# Patient Record
Sex: Male | Born: 1955
Health system: Southern US, Community
[De-identification: ages and names within clinical notes are randomized; demographics above are authoritative.]

## PROBLEM LIST (undated history)

## (undated) DIAGNOSIS — Z973 Presence of spectacles and contact lenses: Secondary | ICD-10-CM

## (undated) DIAGNOSIS — E119 Type 2 diabetes mellitus without complications: Secondary | ICD-10-CM

## (undated) DIAGNOSIS — J189 Pneumonia, unspecified organism: Secondary | ICD-10-CM

## (undated) DIAGNOSIS — D6861 Antiphospholipid syndrome: Secondary | ICD-10-CM

## (undated) DIAGNOSIS — F4310 Post-traumatic stress disorder, unspecified: Secondary | ICD-10-CM

## (undated) DIAGNOSIS — M722 Plantar fascial fibromatosis: Secondary | ICD-10-CM

## (undated) DIAGNOSIS — Z86718 Personal history of other venous thrombosis and embolism: Secondary | ICD-10-CM

## (undated) DIAGNOSIS — F319 Bipolar disorder, unspecified: Secondary | ICD-10-CM

## (undated) DIAGNOSIS — M1990 Unspecified osteoarthritis, unspecified site: Secondary | ICD-10-CM

## (undated) DIAGNOSIS — Z87442 Personal history of urinary calculi: Secondary | ICD-10-CM

## (undated) DIAGNOSIS — G4730 Sleep apnea, unspecified: Secondary | ICD-10-CM

## (undated) DIAGNOSIS — J45909 Unspecified asthma, uncomplicated: Secondary | ICD-10-CM

## (undated) HISTORY — PX: EYE SURGERY: SHX253

## (undated) HISTORY — PX: CYSTOSCOPY: SUR368

## (undated) HISTORY — PX: KNEE ARTHROPLASTY: SHX992

## (undated) HISTORY — DX: Post-traumatic stress disorder, unspecified: F43.10

## (undated) HISTORY — DX: Plantar fascial fibromatosis: M72.2

## (undated) HISTORY — DX: Presence of spectacles and contact lenses: Z97.3

## (undated) HISTORY — PX: TRANSURETHRAL RESECTION OF PROSTATE: SHX73

## (undated) HISTORY — PX: TONSILLECTOMY AND ADENOIDECTOMY: SUR1326

## (undated) HISTORY — PX: KNEE ARTHROSCOPY: SUR90

## (undated) HISTORY — DX: Personal history of other venous thrombosis and embolism: Z86.718

## (undated) HISTORY — PX: UMBILICAL HERNIA REPAIR: SHX196

## (undated) HISTORY — PX: CATARACT EXTRACTION: SUR2

## (undated) HISTORY — DX: Sleep apnea, unspecified: G47.30

## (undated) HISTORY — DX: Type 2 diabetes mellitus without complications: E11.9

## (undated) HISTORY — DX: Pneumonia, unspecified organism: J18.9

## (undated) HISTORY — PX: COLLATERAL LIGAMENT REPAIR, KNEE: SHX601

## (undated) HISTORY — DX: Bipolar disorder, unspecified: F31.9

## (undated) HISTORY — PX: OTHER SURGICAL HISTORY: SHX169

---

## 2008-03-15 ENCOUNTER — Ambulatory Visit: Payer: Self-pay | Admitting: Vascular Surgery

## 2009-07-11 ENCOUNTER — Ambulatory Visit (HOSPITAL_COMMUNITY): Admission: RE | Admit: 2009-07-11 | Discharge: 2009-07-11 | Payer: Self-pay | Admitting: Surgery

## 2009-07-13 ENCOUNTER — Ambulatory Visit (HOSPITAL_COMMUNITY): Admission: RE | Admit: 2009-07-13 | Discharge: 2009-07-13 | Payer: Self-pay | Admitting: Surgery

## 2009-11-09 ENCOUNTER — Encounter: Admission: RE | Admit: 2009-11-09 | Discharge: 2009-12-29 | Payer: Self-pay | Admitting: Surgery

## 2010-02-02 ENCOUNTER — Ambulatory Visit (HOSPITAL_COMMUNITY): Admission: RE | Admit: 2010-02-02 | Discharge: 2010-02-02 | Payer: Self-pay | Admitting: Surgery

## 2010-02-12 ENCOUNTER — Ambulatory Visit (HOSPITAL_COMMUNITY): Admission: RE | Admit: 2010-02-12 | Discharge: 2010-02-12 | Payer: Self-pay | Admitting: Surgery

## 2010-03-20 ENCOUNTER — Inpatient Hospital Stay (HOSPITAL_COMMUNITY)
Admission: RE | Admit: 2010-03-20 | Discharge: 2010-03-21 | Payer: Self-pay | Source: Home / Self Care | Attending: Surgery | Admitting: Surgery

## 2010-03-21 ENCOUNTER — Encounter (INDEPENDENT_AMBULATORY_CARE_PROVIDER_SITE_OTHER): Payer: Self-pay | Admitting: Surgery

## 2010-04-03 ENCOUNTER — Encounter
Admission: RE | Admit: 2010-04-03 | Discharge: 2010-05-01 | Payer: Self-pay | Source: Home / Self Care | Attending: Surgery | Admitting: Surgery

## 2010-05-14 ENCOUNTER — Encounter: Admit: 2010-05-14 | Payer: Self-pay | Admitting: Surgery

## 2010-05-14 ENCOUNTER — Ambulatory Visit: Payer: Self-pay | Admitting: *Deleted

## 2010-05-31 ENCOUNTER — Encounter: Payer: Medicare Other | Attending: Surgery | Admitting: *Deleted

## 2010-05-31 DIAGNOSIS — Z9884 Bariatric surgery status: Secondary | ICD-10-CM | POA: Insufficient documentation

## 2010-05-31 DIAGNOSIS — Z09 Encounter for follow-up examination after completed treatment for conditions other than malignant neoplasm: Secondary | ICD-10-CM | POA: Insufficient documentation

## 2010-05-31 DIAGNOSIS — Z713 Dietary counseling and surveillance: Secondary | ICD-10-CM | POA: Insufficient documentation

## 2010-06-11 LAB — CBC
HCT: 48.1 % (ref 39.0–52.0)
Hemoglobin: 14.9 g/dL (ref 13.0–17.0)
Hemoglobin: 15.7 g/dL (ref 13.0–17.0)
MCH: 28 pg (ref 26.0–34.0)
MCV: 85.4 fL (ref 78.0–100.0)
RBC: 5.33 MIL/uL (ref 4.22–5.81)
WBC: 9.5 10*3/uL (ref 4.0–10.5)

## 2010-06-11 LAB — COMPREHENSIVE METABOLIC PANEL
ALT: 40 U/L (ref 0–53)
Alkaline Phosphatase: 50 U/L (ref 39–117)
CO2: 29 mEq/L (ref 19–32)
Chloride: 101 mEq/L (ref 96–112)
GFR calc non Af Amer: >60 mL/min (ref >60)
Glucose, Bld: 275 mg/dL — ABNORMAL HIGH (ref 70–99)
Potassium: 3.8 mEq/L (ref 3.5–5.1)
Sodium: 137 mEq/L (ref 135–145)
Total Bilirubin: 0.7 mg/dL (ref 0.3–1.2)

## 2010-06-11 LAB — GLUCOSE, CAPILLARY
Glucose-Capillary: 261 mg/dL — ABNORMAL HIGH (ref 70–99)
Glucose-Capillary: 302 mg/dL — ABNORMAL HIGH (ref 70–99)

## 2010-06-11 LAB — DIFFERENTIAL
Basophils Absolute: 0 10*3/uL (ref 0.0–0.1)
Basophils Relative: 0 % (ref 0–1)
Eosinophils Absolute: 0.3 10*3/uL (ref 0.0–0.7)
Eosinophils Absolute: 0.4 10*3/uL (ref 0.0–0.7)
Lymphs Abs: 2.6 10*3/uL (ref 0.7–4.0)
Monocytes Relative: 14 % — ABNORMAL HIGH (ref 3–12)
Monocytes Relative: 17 % — ABNORMAL HIGH (ref 3–12)
Neutro Abs: 7.2 10*3/uL (ref 1.7–7.7)
Neutrophils Relative %: 57 % (ref 43–77)
Neutrophils Relative %: 59 % (ref 43–77)

## 2010-06-11 LAB — HEMOGLOBIN A1C
Hgb A1c MFr Bld: 11.3 % — ABNORMAL HIGH (ref <5.7)
Mean Plasma Glucose: 278 mg/dL — ABNORMAL HIGH (ref <117)

## 2010-06-11 LAB — HEMOGLOBIN AND HEMATOCRIT, BLOOD: HCT: 46.3 % (ref 39.0–52.0)

## 2010-06-12 LAB — COMPREHENSIVE METABOLIC PANEL
ALT: 62 U/L — ABNORMAL HIGH (ref 0–53)
Albumin: 3.8 g/dL (ref 3.5–5.2)
Alkaline Phosphatase: 61 U/L (ref 39–117)
Potassium: 4.4 mEq/L (ref 3.5–5.1)
Sodium: 136 mEq/L (ref 135–145)
Total Protein: 7 g/dL (ref 6.0–8.3)

## 2010-06-12 LAB — SURGICAL PCR SCREEN
MRSA, PCR: NEGATIVE
Staphylococcus aureus: NEGATIVE

## 2010-06-12 LAB — CBC
HCT: 45.9 % (ref 39.0–52.0)
Hemoglobin: 15.5 g/dL (ref 13.0–17.0)
MCH: 27.9 pg (ref 26.0–34.0)
MCHC: 33.9 g/dL (ref 30.0–36.0)
MCV: 82.5 fL (ref 78.0–100.0)
Platelets: 203 10*3/uL (ref 150–400)
RBC: 5.56 MIL/uL (ref 4.22–5.81)
RDW: 14.1 % (ref 11.5–15.5)
WBC: 10.9 10*3/uL — ABNORMAL HIGH (ref 4.0–10.5)

## 2010-06-12 LAB — DIFFERENTIAL
Basophils Relative: 1 % (ref 0–1)
Basophils Relative: 1 % (ref 0–1)
Eosinophils Absolute: 0.3 10*3/uL (ref 0.0–0.7)
Eosinophils Absolute: 0.3 10*3/uL (ref 0.0–0.7)
Lymphs Abs: 2 10*3/uL (ref 0.7–4.0)
Lymphs Abs: 2.5 10*3/uL (ref 0.7–4.0)
Monocytes Absolute: 0.9 10*3/uL (ref 0.1–1.0)
Monocytes Absolute: 1.1 10*3/uL — ABNORMAL HIGH (ref 0.1–1.0)
Monocytes Relative: 10 % (ref 3–12)
Monocytes Relative: 9 % (ref 3–12)
Neutro Abs: 7.5 10*3/uL (ref 1.7–7.7)

## 2010-06-12 LAB — GLUCOSE, CAPILLARY: Glucose-Capillary: 232 mg/dL — ABNORMAL HIGH (ref 70–99)

## 2010-07-31 ENCOUNTER — Ambulatory Visit: Payer: Medicare Other | Admitting: *Deleted

## 2010-08-14 NOTE — Consult Note (Signed)
VASCULAR SURGERY CONSULTATION   [name], [name]  DOB:  1956-03-29                                       03/15/2008  HYQMV#:[id number]   The patient is a 55 year old male referred by Lonie Peak, PA for  vascular surgery consultation regarding a history of DVT in the right  leg and swelling in both lower extremities.  This morbidly obese 52-year-  old male patient had arthroscopy performed on his right knee in August  of 2009.  Two weeks later he developed swelling in both lower  extremities and a duplex scan revealed thrombus in his right superficial  femoral artery in the distal thigh but no DVT in the left leg although a  Baker's cyst was noted.  He had a followup study performed in September  of 2009 in [place] at [facility] which revealed  persistent DVT in the right superficial femoral vein with no other  thrombus noted.  He has had continued edema with some days worse than  others and it has been fairly similar in both lower extremities below  the knees.  He is wearing elastic compression stockings and trying to  elevate his legs at night.  He is on chronic Coumadin therapy at this  time.  He has not had a followup ultrasound since September.  He has no  history of other thrombotic problems or pulmonary emboli.  His walking  is limited by his legs as well as dyspnea.   PAST MEDICAL HISTORY:  1. Bipolar manic depressive disorder.  2. Diabetes mellitus non-insulin-dependent.  3. Hypertension.  4. Hyperlipidemia.  5. Degenerative joint disease.  6. History of deep venous thrombosis right leg.  7. Negative for coronary artery disease, COPD or stroke.   PAST SURGICAL HISTORY:  1. Right knee arthroscopy.  2. Left knee reconstruction.   FAMILY HISTORY:  Is positive coronary artery disease and diabetes in his  father.  Negative for stroke.   SOCIAL HISTORY:  Married, he is retired from [employer] after 20+ years.  He has not smoked since 2005,  smoked half pack of cigarettes per day for  30 years.  Does not use alcohol.   REVIEW OF SYSTEMS:  Significantly primarily for weight gain, dyspnea on  exertion and occasional diarrhea and constipation, urinary frequency and  multiple joint problems.   ALLERGIES:  Penicillin.   MEDICATIONS:  Please see health history form.   PHYSICAL EXAM:  Vital signs:  Blood pressure 115/70, heart rate is 90,  respirations 16.  General:  He is a morbidly obese male patient who is  in no apparent distress, he is alert and oriented x3.  Neck:  Supple and  3+ carotid pulses palpable.  No bruits are audible.  Neurological:  Normal.  No palpable adenopathy in the neck.  Chest:  Clear to  auscultation.  Cardiovascular:  Regular rhythm.  No murmurs.  Abdomen:  Is obese.  No palpable masses.  He has femoral pulses which are  difficult to palpate.  He does have 2+ dorsalis pedis pulses  bilaterally.  Both legs are edematous throughout.  He has no stasis  ulcers.  No hyperpigmentation or ulceration is noted although there are  a few areas of thin skin in the pretibial region on the right side with  no weeping of fluid at this time.  No ulcerations are  noted.   We repeated a venous duplex exam in our office today which reveals no  evidence of deep venous thrombosis in the right leg at the present time  and no deep venous insufficiency.   I feel that the superficial femoral vein thrombosis from August 2009 is  resolved with complete lysis with no evidence of residual DVT.  I would  recommend continuing his Coumadin for a total of 6 months which would be  2 more months of Coumadin.  At that time I would discontinue it and  continue him on aspirin on a regular basis unless he has any further  thrombotic problems in the future.  We discussed effective elevation of  the legs as well as elastic compression stockings to treat his edema.   Quita Skye Hart Rochester, M.D.  Electronically Signed  JDL/MEDQ  D:  03/15/2008   T:  03/16/2008  Job:  [id number]   cc:   Lonie Peak, PA

## 2010-08-14 NOTE — Procedures (Signed)
DUPLEX DEEP VENOUS EXAM - LOWER EXTREMITY   INDICATION:  History of DVT with right leg pain and swelling.   HISTORY:  Edema:  Yes.  Trauma/Surgery:  No.  Pain:  Yes.  PE:  No.  Previous DVT:  Yes.  Right leg.  Anticoagulants:  Yes.  Coumadin.  Other:   DUPLEX EXAM:                CFV   SFV   PopV  PTV    GSV                R  L  R  L  R  L  R   L  R  L  Thrombosis    0  0  0     0     0      0  Spontaneous   +  +  +     +     +      +  Phasic        +  +  +     +     +      +  Augmentation  +  +  +     +     +      +  Compressible  +  +  +     +     +      +  Competent     +  +  +     +     +      +   Legend:  + - yes  o - no  p - partial  D - decreased   IMPRESSION:  No evidence of DVT noted in the right leg.    _____________________________  Quita Skye Hart [place], M.D.   MG/MEDQ  D:  03/16/2008  T:  03/16/2008  Job:  [id number]

## 2010-08-15 ENCOUNTER — Ambulatory Visit: Payer: Medicare Other | Admitting: *Deleted

## 2010-08-28 ENCOUNTER — Encounter: Payer: Medicare Other | Attending: Surgery | Admitting: *Deleted

## 2010-08-28 DIAGNOSIS — Z713 Dietary counseling and surveillance: Secondary | ICD-10-CM | POA: Insufficient documentation

## 2010-08-28 DIAGNOSIS — Z09 Encounter for follow-up examination after completed treatment for conditions other than malignant neoplasm: Secondary | ICD-10-CM | POA: Insufficient documentation

## 2010-08-28 DIAGNOSIS — Z9884 Bariatric surgery status: Secondary | ICD-10-CM | POA: Insufficient documentation

## 2010-09-21 ENCOUNTER — Encounter (INDEPENDENT_AMBULATORY_CARE_PROVIDER_SITE_OTHER): Payer: Self-pay | Admitting: Surgery

## 2010-10-05 ENCOUNTER — Encounter (INDEPENDENT_AMBULATORY_CARE_PROVIDER_SITE_OTHER): Payer: Self-pay | Admitting: Surgery

## 2010-10-05 ENCOUNTER — Ambulatory Visit (INDEPENDENT_AMBULATORY_CARE_PROVIDER_SITE_OTHER): Payer: Medicare Other | Admitting: Surgery

## 2010-10-05 ENCOUNTER — Encounter (INDEPENDENT_AMBULATORY_CARE_PROVIDER_SITE_OTHER): Payer: Self-pay | Admitting: General Surgery

## 2010-10-05 DIAGNOSIS — Z4651 Encounter for fitting and adjustment of gastric lap band: Secondary | ICD-10-CM

## 2010-10-05 DIAGNOSIS — F509 Eating disorder, unspecified: Secondary | ICD-10-CM

## 2010-10-05 DIAGNOSIS — Z9884 Bariatric surgery status: Secondary | ICD-10-CM

## 2010-10-05 NOTE — Progress Notes (Signed)
The patient comes in today complaining that he spits up most of the solid foods a dressing. He had a half a cc taken out on June 22 and he. Today went ahead and accessed the port removed another half cc. Asking the Collis next week and see if he is able to swallow better. If not and he needs to come back and we'll take out some more. At that point might consider getting an upper GI.

## 2010-10-15 ENCOUNTER — Telehealth (INDEPENDENT_AMBULATORY_CARE_PROVIDER_SITE_OTHER): Payer: Self-pay | Admitting: General Surgery

## 2010-11-09 ENCOUNTER — Ambulatory Visit (INDEPENDENT_AMBULATORY_CARE_PROVIDER_SITE_OTHER): Payer: Medicare Other | Admitting: Physician Assistant

## 2010-11-09 ENCOUNTER — Encounter (INDEPENDENT_AMBULATORY_CARE_PROVIDER_SITE_OTHER): Payer: Self-pay | Admitting: Physician Assistant

## 2010-11-09 VITALS — BP 104/78 | Ht 65.5 in | Wt 285.6 lb

## 2010-11-09 DIAGNOSIS — Z4651 Encounter for fitting and adjustment of gastric lap band: Secondary | ICD-10-CM

## 2010-11-09 NOTE — Patient Instructions (Signed)
Take clear liquids for the next 48 hours. Thin protein shakes are ok to start on Saturday evening. Call us if you have persistent vomiting or regurgitation, night cough or reflux symptoms. Return as scheduled or sooner if you notice no changes in hunger/portion sizes.   

## 2010-11-09 NOTE — Progress Notes (Signed)
  HISTORY: Jaime Dean is a 55 y.o.male who received an AP-Large lap-band in December 2011 by Dr. Daphine Deutscher. He comes in having had 2 fluid reductions and is now complaining of significant appetite and hunger. He denies regurgitation symptoms. He has gained 12 pounds in the past month.  VITAL SIGNS: Filed Vitals:   11/09/10 1500  BP: 104/78    PHYSICAL EXAM: Physical exam reveals a very well-appearing 55 y.o.male in no apparent distress Neurologic: Awake, alert, oriented Psych: Bright affect, conversant Respiratory: Breathing even and unlabored. No stridor or wheezing Abdomen: Soft, nontender, nondistended to palpation. Incisions well-healed. No incisional hernias. Port easily palpated. Extremities: Atraumatic, good range of motion.  ASSESMENT: 55 y.o.  male  s/p AP-Large lap-band.   PLAN: The patient's port was accessed with a 20G Huber needle without difficulty. Clear fluid was aspirated and 0.2 mL saline was added to the port. The patient was able to swallow water without difficulty following the procedure and was instructed to take clear liquids for the next 24-48 hours and advance slowly as tolerated.

## 2010-11-26 ENCOUNTER — Ambulatory Visit: Admitting: *Deleted

## 2010-12-06 ENCOUNTER — Ambulatory Visit: Admitting: *Deleted

## 2010-12-07 ENCOUNTER — Encounter (INDEPENDENT_AMBULATORY_CARE_PROVIDER_SITE_OTHER): Payer: Medicare Other

## 2011-04-03 DIAGNOSIS — M7530 Calcific tendinitis of unspecified shoulder: Secondary | ICD-10-CM | POA: Diagnosis not present

## 2011-04-12 DIAGNOSIS — E291 Testicular hypofunction: Secondary | ICD-10-CM | POA: Diagnosis not present

## 2011-04-16 DIAGNOSIS — J029 Acute pharyngitis, unspecified: Secondary | ICD-10-CM | POA: Diagnosis not present

## 2011-04-22 DIAGNOSIS — J209 Acute bronchitis, unspecified: Secondary | ICD-10-CM | POA: Diagnosis not present

## 2011-04-29 DIAGNOSIS — J209 Acute bronchitis, unspecified: Secondary | ICD-10-CM | POA: Diagnosis not present

## 2011-04-29 DIAGNOSIS — E291 Testicular hypofunction: Secondary | ICD-10-CM | POA: Diagnosis not present

## 2011-05-09 DIAGNOSIS — R059 Cough, unspecified: Secondary | ICD-10-CM | POA: Diagnosis not present

## 2011-05-09 DIAGNOSIS — R942 Abnormal results of pulmonary function studies: Secondary | ICD-10-CM | POA: Diagnosis not present

## 2011-05-09 DIAGNOSIS — R05 Cough: Secondary | ICD-10-CM | POA: Diagnosis not present

## 2011-05-09 DIAGNOSIS — R0602 Shortness of breath: Secondary | ICD-10-CM | POA: Diagnosis not present

## 2011-05-13 DIAGNOSIS — E7800 Pure hypercholesterolemia, unspecified: Secondary | ICD-10-CM | POA: Diagnosis not present

## 2011-05-13 DIAGNOSIS — Z79899 Other long term (current) drug therapy: Secondary | ICD-10-CM | POA: Diagnosis not present

## 2011-05-13 DIAGNOSIS — I10 Essential (primary) hypertension: Secondary | ICD-10-CM | POA: Diagnosis not present

## 2011-05-13 DIAGNOSIS — E291 Testicular hypofunction: Secondary | ICD-10-CM | POA: Diagnosis not present

## 2011-05-15 DIAGNOSIS — M67919 Unspecified disorder of synovium and tendon, unspecified shoulder: Secondary | ICD-10-CM | POA: Diagnosis not present

## 2011-05-15 DIAGNOSIS — M19019 Primary osteoarthritis, unspecified shoulder: Secondary | ICD-10-CM | POA: Diagnosis not present

## 2011-05-15 DIAGNOSIS — M25519 Pain in unspecified shoulder: Secondary | ICD-10-CM | POA: Diagnosis not present

## 2011-05-15 DIAGNOSIS — M719 Bursopathy, unspecified: Secondary | ICD-10-CM | POA: Diagnosis not present

## 2011-05-28 DIAGNOSIS — E291 Testicular hypofunction: Secondary | ICD-10-CM | POA: Diagnosis not present

## 2011-06-05 ENCOUNTER — Ambulatory Visit: Payer: Medicare Other | Admitting: *Deleted

## 2011-06-11 DIAGNOSIS — G4733 Obstructive sleep apnea (adult) (pediatric): Secondary | ICD-10-CM | POA: Diagnosis not present

## 2011-06-11 DIAGNOSIS — D751 Secondary polycythemia: Secondary | ICD-10-CM | POA: Diagnosis not present

## 2011-06-11 DIAGNOSIS — E291 Testicular hypofunction: Secondary | ICD-10-CM | POA: Diagnosis not present

## 2011-06-11 DIAGNOSIS — E119 Type 2 diabetes mellitus without complications: Secondary | ICD-10-CM | POA: Diagnosis not present

## 2011-06-11 DIAGNOSIS — K429 Umbilical hernia without obstruction or gangrene: Secondary | ICD-10-CM | POA: Diagnosis not present

## 2011-06-18 DIAGNOSIS — M1710 Unilateral primary osteoarthritis, unspecified knee: Secondary | ICD-10-CM | POA: Diagnosis not present

## 2011-06-18 DIAGNOSIS — M25569 Pain in unspecified knee: Secondary | ICD-10-CM | POA: Diagnosis not present

## 2011-06-24 DIAGNOSIS — IMO0002 Reserved for concepts with insufficient information to code with codable children: Secondary | ICD-10-CM | POA: Diagnosis not present

## 2011-06-24 DIAGNOSIS — M1710 Unilateral primary osteoarthritis, unspecified knee: Secondary | ICD-10-CM | POA: Diagnosis not present

## 2011-06-24 DIAGNOSIS — E7800 Pure hypercholesterolemia, unspecified: Secondary | ICD-10-CM | POA: Diagnosis not present

## 2011-06-24 DIAGNOSIS — Z79899 Other long term (current) drug therapy: Secondary | ICD-10-CM | POA: Diagnosis not present

## 2011-06-24 DIAGNOSIS — I10 Essential (primary) hypertension: Secondary | ICD-10-CM | POA: Diagnosis not present

## 2011-06-24 DIAGNOSIS — E119 Type 2 diabetes mellitus without complications: Secondary | ICD-10-CM | POA: Diagnosis not present

## 2011-07-08 DIAGNOSIS — N318 Other neuromuscular dysfunction of bladder: Secondary | ICD-10-CM | POA: Diagnosis not present

## 2011-07-08 DIAGNOSIS — Z01812 Encounter for preprocedural laboratory examination: Secondary | ICD-10-CM | POA: Diagnosis not present

## 2011-07-08 DIAGNOSIS — N400 Enlarged prostate without lower urinary tract symptoms: Secondary | ICD-10-CM | POA: Diagnosis not present

## 2011-07-08 DIAGNOSIS — N390 Urinary tract infection, site not specified: Secondary | ICD-10-CM | POA: Diagnosis not present

## 2011-07-08 DIAGNOSIS — Z01818 Encounter for other preprocedural examination: Secondary | ICD-10-CM | POA: Diagnosis not present

## 2011-07-08 DIAGNOSIS — R351 Nocturia: Secondary | ICD-10-CM | POA: Diagnosis not present

## 2011-07-09 ENCOUNTER — Encounter (INDEPENDENT_AMBULATORY_CARE_PROVIDER_SITE_OTHER): Payer: Self-pay | Admitting: Surgery

## 2011-07-10 ENCOUNTER — Encounter (INDEPENDENT_AMBULATORY_CARE_PROVIDER_SITE_OTHER): Payer: Self-pay | Admitting: Surgery

## 2011-07-10 ENCOUNTER — Ambulatory Visit (INDEPENDENT_AMBULATORY_CARE_PROVIDER_SITE_OTHER): Payer: Medicare Other | Admitting: Surgery

## 2011-07-10 VITALS — BP 132/72 | HR 72 | Temp 97.6°F | Resp 16 | Ht 65.5 in | Wt 278.8 lb

## 2011-07-10 DIAGNOSIS — E119 Type 2 diabetes mellitus without complications: Secondary | ICD-10-CM | POA: Insufficient documentation

## 2011-07-10 DIAGNOSIS — Z6841 Body Mass Index (BMI) 40.0 and over, adult: Secondary | ICD-10-CM | POA: Insufficient documentation

## 2011-07-10 DIAGNOSIS — E669 Obesity, unspecified: Secondary | ICD-10-CM | POA: Diagnosis not present

## 2011-07-10 DIAGNOSIS — G4733 Obstructive sleep apnea (adult) (pediatric): Secondary | ICD-10-CM | POA: Insufficient documentation

## 2011-07-10 DIAGNOSIS — Z9884 Bariatric surgery status: Secondary | ICD-10-CM | POA: Diagnosis not present

## 2011-07-10 NOTE — Progress Notes (Signed)
Avedis D Resendes 56 y.o.  Body mass index is 45.69 kg/(m^2).  Patient Active Problem List  Diagnoses  . Diabetes mellitus  . OSA (obstructive sleep apnea)  . Obesity-BMI 45    Allergies  Allergen Reactions  . Penicillins     Past Surgical History  Procedure Date  . Knee arthroscopy     right knee  . Collateral ligament repair, knee    CONROY,NATHAN, PA, PA 1. Lapband APL Dec 2011   2. Diabetes mellitus   3. OSA (obstructive sleep apnea)   4. Obesity-BMI 45     Mr. Koeppen is tight enough. We talked for quite a wall and it is apparent that these developed some maladaptive eating. He potatoes and each corn chips. I talked to him about cutting these out dating back more to protein diet. He is having a knee replacement in a month data  . I told him to get back on his diet no help him get in shape for surgery. I will see him back in 3 months and see if he is made some progress toward losing weight going on a low carbohydrate diet. Matt B. Daphine Deutscher, MD, [facility] [phone] beeper [phone]  07/10/2011 5:53 PM

## 2011-07-25 DIAGNOSIS — M1710 Unilateral primary osteoarthritis, unspecified knee: Secondary | ICD-10-CM | POA: Diagnosis not present

## 2011-07-25 DIAGNOSIS — IMO0002 Reserved for concepts with insufficient information to code with codable children: Secondary | ICD-10-CM | POA: Diagnosis not present

## 2011-07-25 DIAGNOSIS — M25469 Effusion, unspecified knee: Secondary | ICD-10-CM | POA: Diagnosis not present

## 2011-07-25 DIAGNOSIS — M7120 Synovial cyst of popliteal space [Baker], unspecified knee: Secondary | ICD-10-CM | POA: Diagnosis not present

## 2011-07-25 DIAGNOSIS — M25569 Pain in unspecified knee: Secondary | ICD-10-CM | POA: Diagnosis not present

## 2011-08-12 DIAGNOSIS — R609 Edema, unspecified: Secondary | ICD-10-CM | POA: Diagnosis not present

## 2011-08-15 DIAGNOSIS — M1710 Unilateral primary osteoarthritis, unspecified knee: Secondary | ICD-10-CM | POA: Diagnosis not present

## 2011-08-15 IMAGING — CR DG ABDOMEN 1V
2 series · 2 of 2 positions shown · non-contrast
Comparison: None.

CLINICAL DATA: Gastric banding.  Abdominal pain.

ABDOMEN - 1 VIEW

[t abdomen supine *]
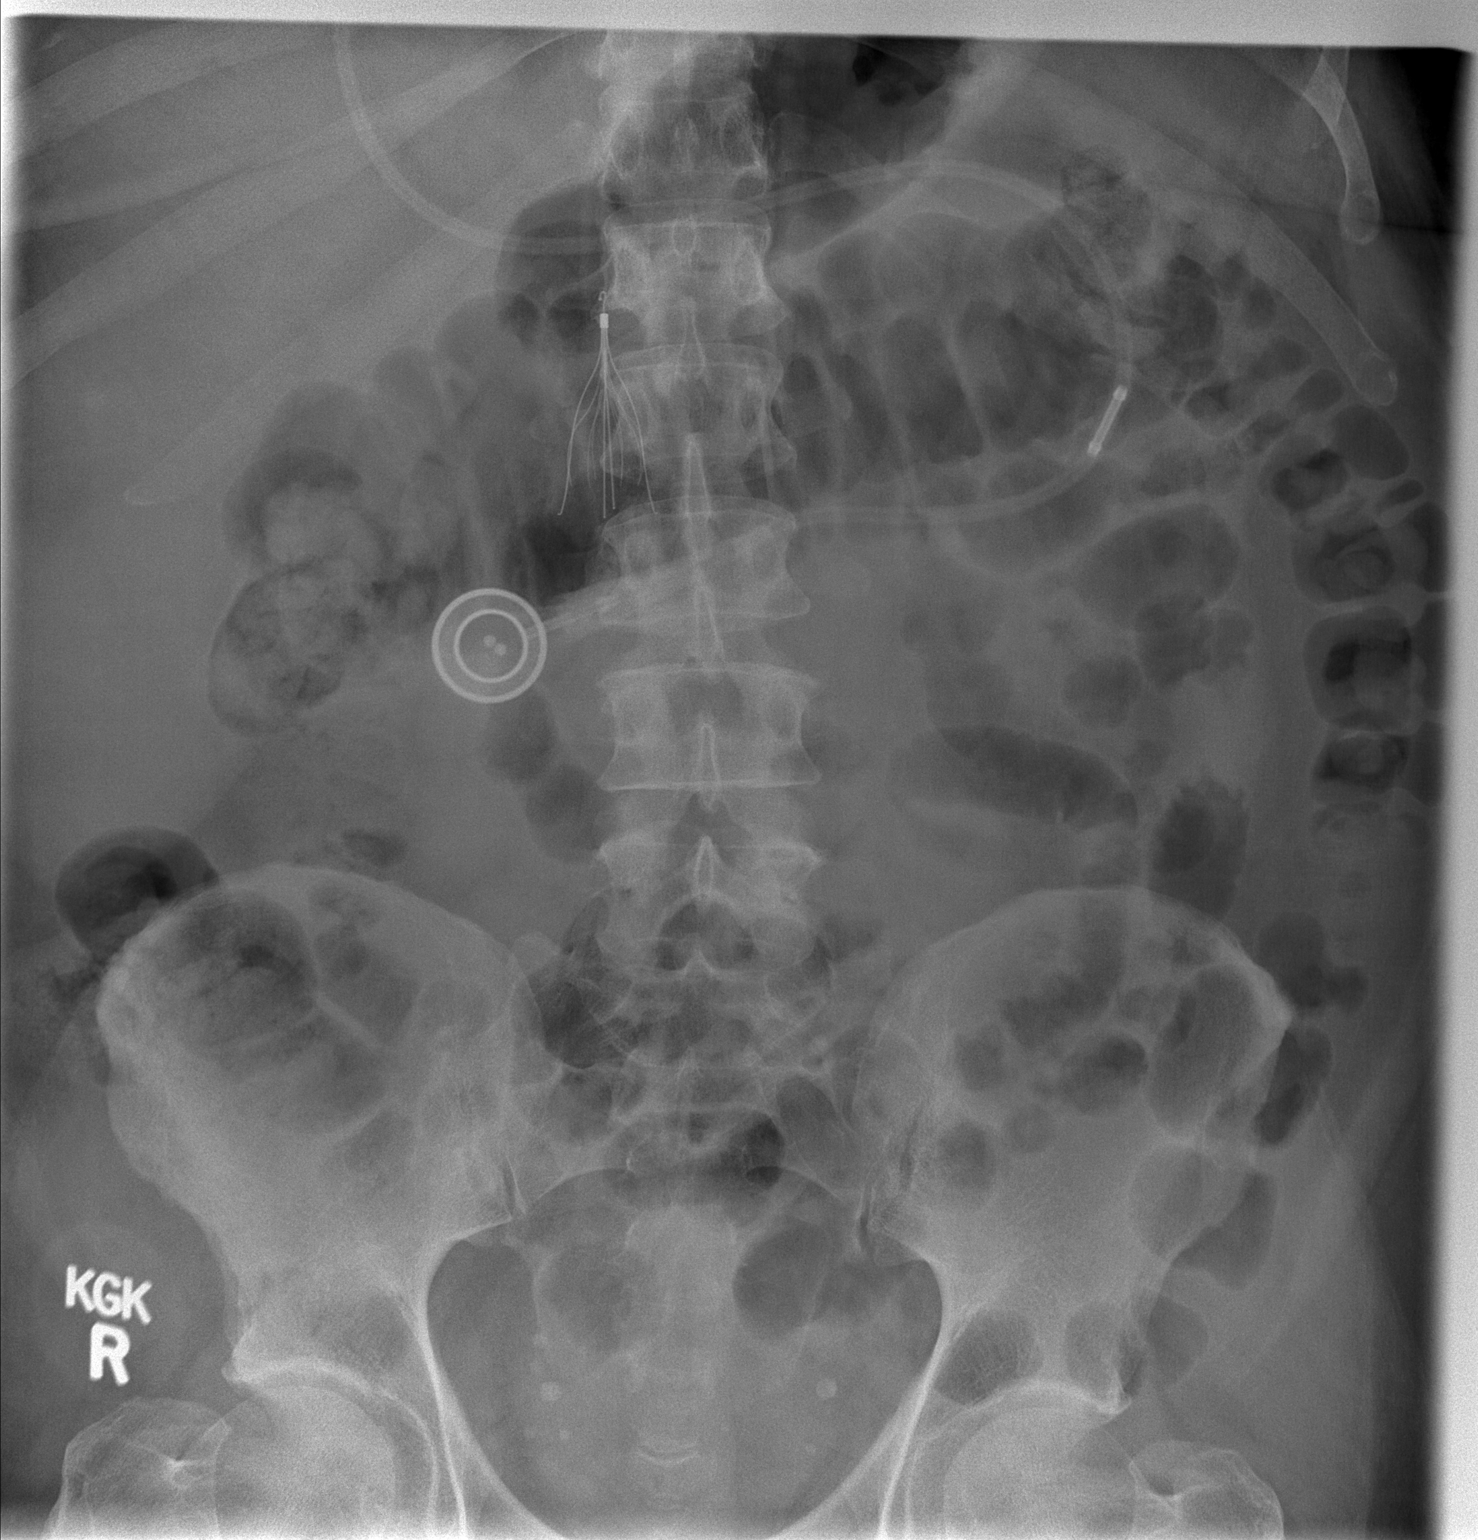

[t abdomen supine]
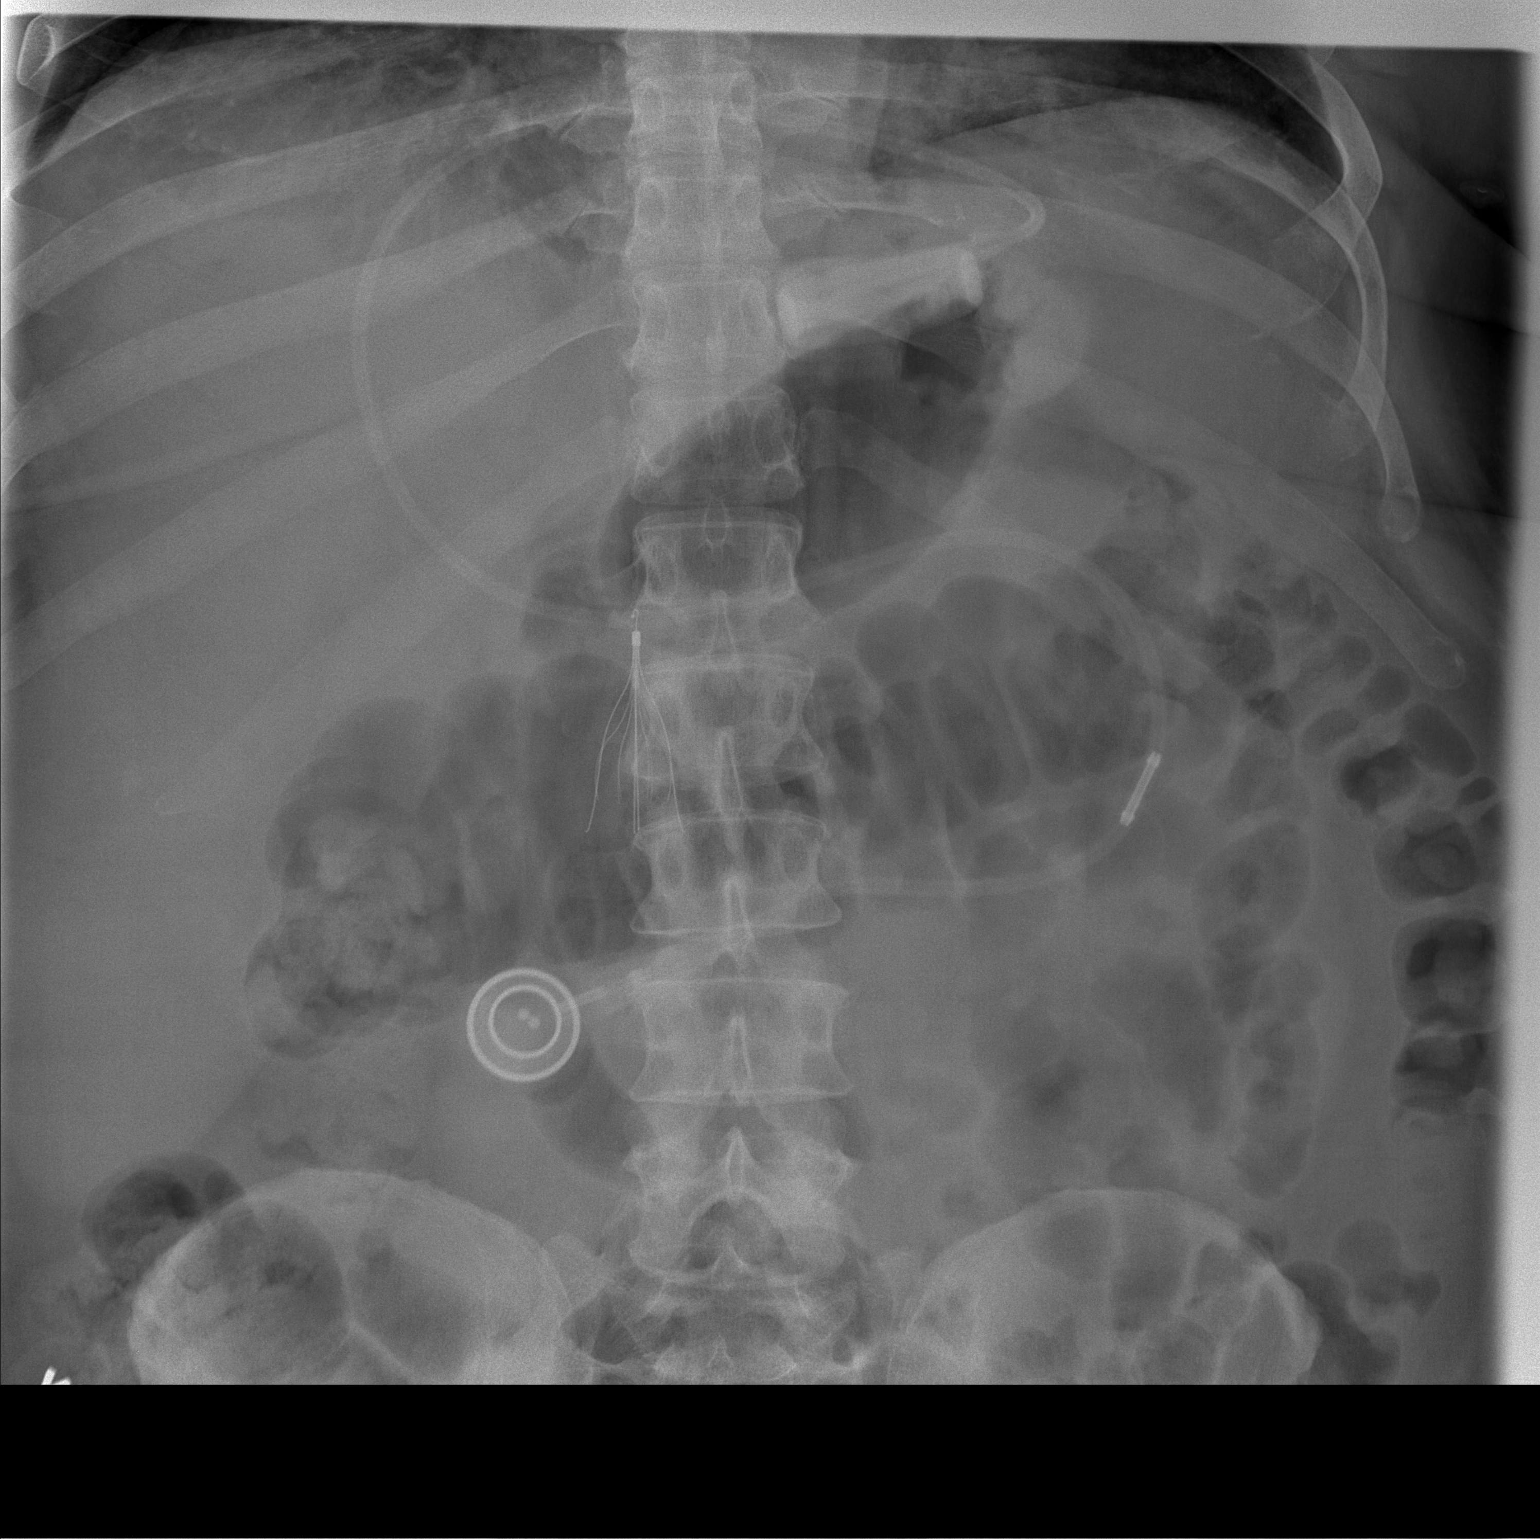

[2 of 2 positions shown; findings below may reference images not displayed]

FINDINGS: Gastric band is in place at the gastroesophageal junction
and fundus oriented at the [DATE] position.  The reservoir projects
over the right lower quadrant and the catheter is intact.  IVC
filter is in place with its tip at the L1 inferior endplate.
Generalized gas filled loops of bowel are seen without
disproportionate dilatation.  Phleboliths project over the pelvis.
IMPRESSION: Nonobstructive bowel gas pattern.

## 2011-08-20 DIAGNOSIS — F3178 Bipolar disorder, in full remission, most recent episode mixed: Secondary | ICD-10-CM | POA: Diagnosis not present

## 2011-08-28 DIAGNOSIS — E669 Obesity, unspecified: Secondary | ICD-10-CM | POA: Diagnosis not present

## 2011-08-28 DIAGNOSIS — M1710 Unilateral primary osteoarthritis, unspecified knee: Secondary | ICD-10-CM | POA: Diagnosis not present

## 2011-08-28 DIAGNOSIS — E785 Hyperlipidemia, unspecified: Secondary | ICD-10-CM | POA: Diagnosis not present

## 2011-08-28 DIAGNOSIS — J310 Chronic rhinitis: Secondary | ICD-10-CM | POA: Diagnosis not present

## 2011-08-28 DIAGNOSIS — G250 Essential tremor: Secondary | ICD-10-CM | POA: Diagnosis not present

## 2011-08-28 DIAGNOSIS — R269 Unspecified abnormalities of gait and mobility: Secondary | ICD-10-CM | POA: Diagnosis not present

## 2011-08-28 DIAGNOSIS — G4700 Insomnia, unspecified: Secondary | ICD-10-CM | POA: Diagnosis present

## 2011-08-28 DIAGNOSIS — F4310 Post-traumatic stress disorder, unspecified: Secondary | ICD-10-CM | POA: Diagnosis present

## 2011-08-28 DIAGNOSIS — E7800 Pure hypercholesterolemia, unspecified: Secondary | ICD-10-CM | POA: Diagnosis not present

## 2011-08-28 DIAGNOSIS — F329 Major depressive disorder, single episode, unspecified: Secondary | ICD-10-CM | POA: Diagnosis present

## 2011-08-28 DIAGNOSIS — G8918 Other acute postprocedural pain: Secondary | ICD-10-CM | POA: Diagnosis not present

## 2011-08-28 DIAGNOSIS — R259 Unspecified abnormal involuntary movements: Secondary | ICD-10-CM | POA: Diagnosis present

## 2011-08-28 DIAGNOSIS — D649 Anemia, unspecified: Secondary | ICD-10-CM | POA: Diagnosis not present

## 2011-08-28 DIAGNOSIS — M1990 Unspecified osteoarthritis, unspecified site: Secondary | ICD-10-CM | POA: Diagnosis not present

## 2011-08-28 DIAGNOSIS — J309 Allergic rhinitis, unspecified: Secondary | ICD-10-CM | POA: Diagnosis present

## 2011-08-28 DIAGNOSIS — Z96659 Presence of unspecified artificial knee joint: Secondary | ICD-10-CM | POA: Diagnosis not present

## 2011-08-28 DIAGNOSIS — Z9884 Bariatric surgery status: Secondary | ICD-10-CM | POA: Diagnosis not present

## 2011-08-28 DIAGNOSIS — G4733 Obstructive sleep apnea (adult) (pediatric): Secondary | ICD-10-CM | POA: Diagnosis not present

## 2011-08-28 DIAGNOSIS — Z8701 Personal history of pneumonia (recurrent): Secondary | ICD-10-CM | POA: Diagnosis not present

## 2011-08-28 DIAGNOSIS — I10 Essential (primary) hypertension: Secondary | ICD-10-CM | POA: Diagnosis present

## 2011-08-28 DIAGNOSIS — Z6841 Body Mass Index (BMI) 40.0 and over, adult: Secondary | ICD-10-CM | POA: Diagnosis not present

## 2011-08-28 DIAGNOSIS — G4730 Sleep apnea, unspecified: Secondary | ICD-10-CM | POA: Diagnosis not present

## 2011-08-28 DIAGNOSIS — Z471 Aftercare following joint replacement surgery: Secondary | ICD-10-CM | POA: Diagnosis not present

## 2011-08-28 DIAGNOSIS — E119 Type 2 diabetes mellitus without complications: Secondary | ICD-10-CM | POA: Diagnosis not present

## 2011-08-28 DIAGNOSIS — M25569 Pain in unspecified knee: Secondary | ICD-10-CM | POA: Diagnosis not present

## 2011-08-28 DIAGNOSIS — F411 Generalized anxiety disorder: Secondary | ICD-10-CM | POA: Diagnosis present

## 2011-08-28 DIAGNOSIS — K5730 Diverticulosis of large intestine without perforation or abscess without bleeding: Secondary | ICD-10-CM | POA: Diagnosis not present

## 2011-09-01 DIAGNOSIS — G8918 Other acute postprocedural pain: Secondary | ICD-10-CM | POA: Diagnosis not present

## 2011-09-01 DIAGNOSIS — R269 Unspecified abnormalities of gait and mobility: Secondary | ICD-10-CM | POA: Diagnosis not present

## 2011-09-01 DIAGNOSIS — M25569 Pain in unspecified knee: Secondary | ICD-10-CM | POA: Diagnosis not present

## 2011-09-01 DIAGNOSIS — G4730 Sleep apnea, unspecified: Secondary | ICD-10-CM | POA: Diagnosis not present

## 2011-09-01 DIAGNOSIS — M1990 Unspecified osteoarthritis, unspecified site: Secondary | ICD-10-CM | POA: Diagnosis not present

## 2011-09-01 DIAGNOSIS — F411 Generalized anxiety disorder: Secondary | ICD-10-CM | POA: Diagnosis not present

## 2011-09-01 DIAGNOSIS — E785 Hyperlipidemia, unspecified: Secondary | ICD-10-CM | POA: Diagnosis not present

## 2011-09-01 DIAGNOSIS — F4310 Post-traumatic stress disorder, unspecified: Secondary | ICD-10-CM | POA: Diagnosis not present

## 2011-09-01 DIAGNOSIS — G4700 Insomnia, unspecified: Secondary | ICD-10-CM | POA: Diagnosis not present

## 2011-09-01 DIAGNOSIS — E119 Type 2 diabetes mellitus without complications: Secondary | ICD-10-CM | POA: Diagnosis not present

## 2011-09-01 DIAGNOSIS — Z96659 Presence of unspecified artificial knee joint: Secondary | ICD-10-CM | POA: Diagnosis not present

## 2011-09-01 DIAGNOSIS — J310 Chronic rhinitis: Secondary | ICD-10-CM | POA: Diagnosis not present

## 2011-09-01 DIAGNOSIS — E669 Obesity, unspecified: Secondary | ICD-10-CM | POA: Diagnosis not present

## 2011-09-01 DIAGNOSIS — Z471 Aftercare following joint replacement surgery: Secondary | ICD-10-CM | POA: Diagnosis not present

## 2011-09-01 DIAGNOSIS — E7800 Pure hypercholesterolemia, unspecified: Secondary | ICD-10-CM | POA: Diagnosis not present

## 2011-09-01 DIAGNOSIS — D649 Anemia, unspecified: Secondary | ICD-10-CM | POA: Diagnosis not present

## 2011-09-01 DIAGNOSIS — M1710 Unilateral primary osteoarthritis, unspecified knee: Secondary | ICD-10-CM | POA: Diagnosis not present

## 2011-09-01 DIAGNOSIS — I130 Hypertensive heart and chronic kidney disease with heart failure and stage 1 through stage 4 chronic kidney disease, or unspecified chronic kidney disease: Secondary | ICD-10-CM | POA: Diagnosis not present

## 2011-09-01 DIAGNOSIS — E1159 Type 2 diabetes mellitus with other circulatory complications: Secondary | ICD-10-CM | POA: Diagnosis not present

## 2011-09-02 DIAGNOSIS — G8918 Other acute postprocedural pain: Secondary | ICD-10-CM | POA: Diagnosis not present

## 2011-09-02 DIAGNOSIS — I130 Hypertensive heart and chronic kidney disease with heart failure and stage 1 through stage 4 chronic kidney disease, or unspecified chronic kidney disease: Secondary | ICD-10-CM | POA: Diagnosis not present

## 2011-09-02 DIAGNOSIS — E1159 Type 2 diabetes mellitus with other circulatory complications: Secondary | ICD-10-CM | POA: Diagnosis not present

## 2011-09-02 DIAGNOSIS — Z96659 Presence of unspecified artificial knee joint: Secondary | ICD-10-CM | POA: Diagnosis not present

## 2011-09-16 DIAGNOSIS — M25569 Pain in unspecified knee: Secondary | ICD-10-CM | POA: Diagnosis not present

## 2011-09-17 DIAGNOSIS — R609 Edema, unspecified: Secondary | ICD-10-CM | POA: Diagnosis not present

## 2011-09-17 DIAGNOSIS — L02419 Cutaneous abscess of limb, unspecified: Secondary | ICD-10-CM | POA: Diagnosis not present

## 2011-09-17 DIAGNOSIS — E119 Type 2 diabetes mellitus without complications: Secondary | ICD-10-CM | POA: Diagnosis not present

## 2011-09-17 DIAGNOSIS — L03119 Cellulitis of unspecified part of limb: Secondary | ICD-10-CM | POA: Diagnosis not present

## 2011-09-20 DIAGNOSIS — R609 Edema, unspecified: Secondary | ICD-10-CM | POA: Diagnosis not present

## 2011-09-20 DIAGNOSIS — L02419 Cutaneous abscess of limb, unspecified: Secondary | ICD-10-CM | POA: Diagnosis not present

## 2011-09-20 DIAGNOSIS — M25569 Pain in unspecified knee: Secondary | ICD-10-CM | POA: Diagnosis not present

## 2011-09-23 DIAGNOSIS — M25569 Pain in unspecified knee: Secondary | ICD-10-CM | POA: Diagnosis not present

## 2011-09-24 DIAGNOSIS — E119 Type 2 diabetes mellitus without complications: Secondary | ICD-10-CM | POA: Diagnosis not present

## 2011-09-24 DIAGNOSIS — R609 Edema, unspecified: Secondary | ICD-10-CM | POA: Diagnosis not present

## 2011-09-24 DIAGNOSIS — L03119 Cellulitis of unspecified part of limb: Secondary | ICD-10-CM | POA: Diagnosis not present

## 2011-09-24 DIAGNOSIS — L02419 Cutaneous abscess of limb, unspecified: Secondary | ICD-10-CM | POA: Diagnosis not present

## 2011-09-24 DIAGNOSIS — M25569 Pain in unspecified knee: Secondary | ICD-10-CM | POA: Diagnosis not present

## 2011-09-30 DIAGNOSIS — M25569 Pain in unspecified knee: Secondary | ICD-10-CM | POA: Diagnosis not present

## 2011-10-01 DIAGNOSIS — R609 Edema, unspecified: Secondary | ICD-10-CM | POA: Diagnosis not present

## 2011-10-01 DIAGNOSIS — L02419 Cutaneous abscess of limb, unspecified: Secondary | ICD-10-CM | POA: Diagnosis not present

## 2011-10-01 DIAGNOSIS — L03119 Cellulitis of unspecified part of limb: Secondary | ICD-10-CM | POA: Diagnosis not present

## 2011-10-01 DIAGNOSIS — M79609 Pain in unspecified limb: Secondary | ICD-10-CM | POA: Diagnosis not present

## 2011-10-04 DIAGNOSIS — M25569 Pain in unspecified knee: Secondary | ICD-10-CM | POA: Diagnosis not present

## 2011-10-08 DIAGNOSIS — M25569 Pain in unspecified knee: Secondary | ICD-10-CM | POA: Diagnosis not present

## 2011-10-10 DIAGNOSIS — M25569 Pain in unspecified knee: Secondary | ICD-10-CM | POA: Diagnosis not present

## 2011-10-11 DIAGNOSIS — Z96659 Presence of unspecified artificial knee joint: Secondary | ICD-10-CM | POA: Diagnosis not present

## 2011-10-11 DIAGNOSIS — M25569 Pain in unspecified knee: Secondary | ICD-10-CM | POA: Diagnosis not present

## 2011-10-15 DIAGNOSIS — Z79899 Other long term (current) drug therapy: Secondary | ICD-10-CM | POA: Diagnosis not present

## 2011-10-15 DIAGNOSIS — L02419 Cutaneous abscess of limb, unspecified: Secondary | ICD-10-CM | POA: Diagnosis not present

## 2011-10-15 DIAGNOSIS — R609 Edema, unspecified: Secondary | ICD-10-CM | POA: Diagnosis not present

## 2011-10-15 DIAGNOSIS — M76899 Other specified enthesopathies of unspecified lower limb, excluding foot: Secondary | ICD-10-CM | POA: Diagnosis not present

## 2011-10-21 DIAGNOSIS — M25569 Pain in unspecified knee: Secondary | ICD-10-CM | POA: Diagnosis not present

## 2011-10-23 DIAGNOSIS — M25569 Pain in unspecified knee: Secondary | ICD-10-CM | POA: Diagnosis not present

## 2011-10-28 DIAGNOSIS — M25569 Pain in unspecified knee: Secondary | ICD-10-CM | POA: Diagnosis not present

## 2011-11-01 DIAGNOSIS — M25569 Pain in unspecified knee: Secondary | ICD-10-CM | POA: Diagnosis not present

## 2011-11-04 DIAGNOSIS — M25569 Pain in unspecified knee: Secondary | ICD-10-CM | POA: Diagnosis not present

## 2011-11-08 DIAGNOSIS — M25569 Pain in unspecified knee: Secondary | ICD-10-CM | POA: Diagnosis not present

## 2011-11-15 DIAGNOSIS — I10 Essential (primary) hypertension: Secondary | ICD-10-CM | POA: Diagnosis not present

## 2011-11-15 DIAGNOSIS — M76899 Other specified enthesopathies of unspecified lower limb, excluding foot: Secondary | ICD-10-CM | POA: Diagnosis not present

## 2011-11-15 DIAGNOSIS — G250 Essential tremor: Secondary | ICD-10-CM | POA: Diagnosis not present

## 2011-11-15 DIAGNOSIS — E119 Type 2 diabetes mellitus without complications: Secondary | ICD-10-CM | POA: Diagnosis not present

## 2011-11-15 DIAGNOSIS — E291 Testicular hypofunction: Secondary | ICD-10-CM | POA: Diagnosis not present

## 2011-11-15 DIAGNOSIS — Z79899 Other long term (current) drug therapy: Secondary | ICD-10-CM | POA: Diagnosis not present

## 2011-11-19 DIAGNOSIS — F3178 Bipolar disorder, in full remission, most recent episode mixed: Secondary | ICD-10-CM | POA: Diagnosis not present

## 2011-12-16 DIAGNOSIS — M76899 Other specified enthesopathies of unspecified lower limb, excluding foot: Secondary | ICD-10-CM | POA: Diagnosis not present

## 2011-12-31 DIAGNOSIS — Z23 Encounter for immunization: Secondary | ICD-10-CM | POA: Diagnosis not present

## 2012-01-10 DIAGNOSIS — IMO0002 Reserved for concepts with insufficient information to code with codable children: Secondary | ICD-10-CM | POA: Diagnosis not present

## 2012-01-10 DIAGNOSIS — M76899 Other specified enthesopathies of unspecified lower limb, excluding foot: Secondary | ICD-10-CM | POA: Diagnosis not present

## 2012-01-10 DIAGNOSIS — G5700 Lesion of sciatic nerve, unspecified lower limb: Secondary | ICD-10-CM | POA: Diagnosis not present

## 2012-02-04 DIAGNOSIS — E291 Testicular hypofunction: Secondary | ICD-10-CM | POA: Diagnosis not present

## 2012-03-04 DIAGNOSIS — R1032 Left lower quadrant pain: Secondary | ICD-10-CM | POA: Diagnosis not present

## 2012-03-04 DIAGNOSIS — E7800 Pure hypercholesterolemia, unspecified: Secondary | ICD-10-CM | POA: Diagnosis not present

## 2012-03-04 DIAGNOSIS — E291 Testicular hypofunction: Secondary | ICD-10-CM | POA: Diagnosis not present

## 2012-03-17 DIAGNOSIS — R109 Unspecified abdominal pain: Secondary | ICD-10-CM | POA: Diagnosis not present

## 2012-03-17 DIAGNOSIS — R197 Diarrhea, unspecified: Secondary | ICD-10-CM | POA: Diagnosis not present

## 2012-03-24 DIAGNOSIS — R197 Diarrhea, unspecified: Secondary | ICD-10-CM | POA: Diagnosis not present

## 2012-03-31 DIAGNOSIS — J019 Acute sinusitis, unspecified: Secondary | ICD-10-CM | POA: Diagnosis not present

## 2012-03-31 DIAGNOSIS — R05 Cough: Secondary | ICD-10-CM | POA: Diagnosis not present

## 2012-04-02 DIAGNOSIS — M76899 Other specified enthesopathies of unspecified lower limb, excluding foot: Secondary | ICD-10-CM | POA: Diagnosis not present

## 2012-04-02 DIAGNOSIS — E291 Testicular hypofunction: Secondary | ICD-10-CM | POA: Diagnosis not present

## 2012-04-28 DIAGNOSIS — J069 Acute upper respiratory infection, unspecified: Secondary | ICD-10-CM | POA: Diagnosis not present

## 2012-05-21 DIAGNOSIS — R109 Unspecified abdominal pain: Secondary | ICD-10-CM | POA: Diagnosis not present

## 2012-05-21 DIAGNOSIS — G4733 Obstructive sleep apnea (adult) (pediatric): Secondary | ICD-10-CM | POA: Diagnosis not present

## 2012-05-21 DIAGNOSIS — K5289 Other specified noninfective gastroenteritis and colitis: Secondary | ICD-10-CM | POA: Diagnosis not present

## 2012-05-25 DIAGNOSIS — R109 Unspecified abdominal pain: Secondary | ICD-10-CM | POA: Diagnosis not present

## 2012-05-26 DIAGNOSIS — K5730 Diverticulosis of large intestine without perforation or abscess without bleeding: Secondary | ICD-10-CM | POA: Diagnosis not present

## 2012-05-26 DIAGNOSIS — Z9884 Bariatric surgery status: Secondary | ICD-10-CM | POA: Diagnosis not present

## 2012-05-26 DIAGNOSIS — R109 Unspecified abdominal pain: Secondary | ICD-10-CM | POA: Diagnosis not present

## 2012-05-26 DIAGNOSIS — K429 Umbilical hernia without obstruction or gangrene: Secondary | ICD-10-CM | POA: Diagnosis not present

## 2012-05-28 DIAGNOSIS — N390 Urinary tract infection, site not specified: Secondary | ICD-10-CM | POA: Diagnosis not present

## 2012-05-28 DIAGNOSIS — N318 Other neuromuscular dysfunction of bladder: Secondary | ICD-10-CM | POA: Diagnosis not present

## 2012-05-28 DIAGNOSIS — R1084 Generalized abdominal pain: Secondary | ICD-10-CM | POA: Diagnosis not present

## 2012-05-28 DIAGNOSIS — N400 Enlarged prostate without lower urinary tract symptoms: Secondary | ICD-10-CM | POA: Diagnosis not present

## 2012-05-28 DIAGNOSIS — R3129 Other microscopic hematuria: Secondary | ICD-10-CM | POA: Diagnosis not present

## 2012-05-29 DIAGNOSIS — R6889 Other general symptoms and signs: Secondary | ICD-10-CM | POA: Diagnosis not present

## 2012-05-29 DIAGNOSIS — R319 Hematuria, unspecified: Secondary | ICD-10-CM | POA: Diagnosis not present

## 2012-05-29 DIAGNOSIS — K5730 Diverticulosis of large intestine without perforation or abscess without bleeding: Secondary | ICD-10-CM | POA: Diagnosis not present

## 2012-05-29 DIAGNOSIS — R1084 Generalized abdominal pain: Secondary | ICD-10-CM | POA: Diagnosis not present

## 2012-05-29 DIAGNOSIS — K921 Melena: Secondary | ICD-10-CM | POA: Diagnosis not present

## 2012-06-03 DIAGNOSIS — F3178 Bipolar disorder, in full remission, most recent episode mixed: Secondary | ICD-10-CM | POA: Diagnosis not present

## 2012-06-03 DIAGNOSIS — R197 Diarrhea, unspecified: Secondary | ICD-10-CM | POA: Diagnosis not present

## 2012-06-03 DIAGNOSIS — R109 Unspecified abdominal pain: Secondary | ICD-10-CM | POA: Diagnosis not present

## 2012-06-03 DIAGNOSIS — R1032 Left lower quadrant pain: Secondary | ICD-10-CM | POA: Diagnosis not present

## 2012-06-03 DIAGNOSIS — E7800 Pure hypercholesterolemia, unspecified: Secondary | ICD-10-CM | POA: Diagnosis not present

## 2012-06-08 DIAGNOSIS — R197 Diarrhea, unspecified: Secondary | ICD-10-CM | POA: Diagnosis not present

## 2012-06-12 DIAGNOSIS — G252 Other specified forms of tremor: Secondary | ICD-10-CM | POA: Diagnosis not present

## 2012-06-12 DIAGNOSIS — I10 Essential (primary) hypertension: Secondary | ICD-10-CM | POA: Diagnosis not present

## 2012-06-12 DIAGNOSIS — R1032 Left lower quadrant pain: Secondary | ICD-10-CM | POA: Diagnosis not present

## 2012-06-26 DIAGNOSIS — K589 Irritable bowel syndrome without diarrhea: Secondary | ICD-10-CM | POA: Diagnosis not present

## 2012-06-26 DIAGNOSIS — R1032 Left lower quadrant pain: Secondary | ICD-10-CM | POA: Diagnosis not present

## 2012-06-26 DIAGNOSIS — K7689 Other specified diseases of liver: Secondary | ICD-10-CM | POA: Diagnosis not present

## 2012-06-29 DIAGNOSIS — R109 Unspecified abdominal pain: Secondary | ICD-10-CM | POA: Diagnosis not present

## 2012-06-29 DIAGNOSIS — G4733 Obstructive sleep apnea (adult) (pediatric): Secondary | ICD-10-CM | POA: Diagnosis not present

## 2012-07-06 DIAGNOSIS — M76899 Other specified enthesopathies of unspecified lower limb, excluding foot: Secondary | ICD-10-CM | POA: Diagnosis not present

## 2012-07-13 DIAGNOSIS — J309 Allergic rhinitis, unspecified: Secondary | ICD-10-CM | POA: Diagnosis not present

## 2012-07-13 DIAGNOSIS — R5381 Other malaise: Secondary | ICD-10-CM | POA: Diagnosis not present

## 2012-07-13 DIAGNOSIS — R5383 Other fatigue: Secondary | ICD-10-CM | POA: Diagnosis not present

## 2012-08-17 DIAGNOSIS — I8390 Asymptomatic varicose veins of unspecified lower extremity: Secondary | ICD-10-CM | POA: Diagnosis not present

## 2012-08-28 DIAGNOSIS — L255 Unspecified contact dermatitis due to plants, except food: Secondary | ICD-10-CM | POA: Diagnosis not present

## 2012-08-28 DIAGNOSIS — Z6841 Body Mass Index (BMI) 40.0 and over, adult: Secondary | ICD-10-CM | POA: Diagnosis not present

## 2012-08-31 DIAGNOSIS — L255 Unspecified contact dermatitis due to plants, except food: Secondary | ICD-10-CM | POA: Diagnosis not present

## 2012-09-18 DIAGNOSIS — E7800 Pure hypercholesterolemia, unspecified: Secondary | ICD-10-CM | POA: Diagnosis not present

## 2012-09-18 DIAGNOSIS — Z125 Encounter for screening for malignant neoplasm of prostate: Secondary | ICD-10-CM | POA: Diagnosis not present

## 2012-09-18 DIAGNOSIS — E291 Testicular hypofunction: Secondary | ICD-10-CM | POA: Diagnosis not present

## 2012-09-18 DIAGNOSIS — IMO0001 Reserved for inherently not codable concepts without codable children: Secondary | ICD-10-CM | POA: Diagnosis not present

## 2012-09-23 DIAGNOSIS — E291 Testicular hypofunction: Secondary | ICD-10-CM | POA: Diagnosis not present

## 2012-09-30 DIAGNOSIS — E291 Testicular hypofunction: Secondary | ICD-10-CM | POA: Diagnosis not present

## 2012-10-07 DIAGNOSIS — E291 Testicular hypofunction: Secondary | ICD-10-CM | POA: Diagnosis not present

## 2012-10-14 DIAGNOSIS — E291 Testicular hypofunction: Secondary | ICD-10-CM | POA: Diagnosis not present

## 2012-10-23 DIAGNOSIS — M76899 Other specified enthesopathies of unspecified lower limb, excluding foot: Secondary | ICD-10-CM | POA: Diagnosis not present

## 2012-10-28 DIAGNOSIS — E7800 Pure hypercholesterolemia, unspecified: Secondary | ICD-10-CM | POA: Diagnosis not present

## 2012-10-28 DIAGNOSIS — E291 Testicular hypofunction: Secondary | ICD-10-CM | POA: Diagnosis not present

## 2012-11-04 DIAGNOSIS — E291 Testicular hypofunction: Secondary | ICD-10-CM | POA: Diagnosis not present

## 2012-11-09 DIAGNOSIS — J019 Acute sinusitis, unspecified: Secondary | ICD-10-CM | POA: Diagnosis not present

## 2012-11-09 DIAGNOSIS — J209 Acute bronchitis, unspecified: Secondary | ICD-10-CM | POA: Diagnosis not present

## 2012-11-18 DIAGNOSIS — E291 Testicular hypofunction: Secondary | ICD-10-CM | POA: Diagnosis not present

## 2012-11-23 DIAGNOSIS — M1710 Unilateral primary osteoarthritis, unspecified knee: Secondary | ICD-10-CM | POA: Diagnosis not present

## 2012-11-23 DIAGNOSIS — M25569 Pain in unspecified knee: Secondary | ICD-10-CM | POA: Diagnosis not present

## 2012-12-01 DIAGNOSIS — F3178 Bipolar disorder, in full remission, most recent episode mixed: Secondary | ICD-10-CM | POA: Diagnosis not present

## 2012-12-07 DIAGNOSIS — R351 Nocturia: Secondary | ICD-10-CM | POA: Diagnosis not present

## 2012-12-07 DIAGNOSIS — N318 Other neuromuscular dysfunction of bladder: Secondary | ICD-10-CM | POA: Diagnosis not present

## 2012-12-07 DIAGNOSIS — N3020 Other chronic cystitis without hematuria: Secondary | ICD-10-CM | POA: Diagnosis not present

## 2012-12-07 DIAGNOSIS — F17200 Nicotine dependence, unspecified, uncomplicated: Secondary | ICD-10-CM | POA: Diagnosis not present

## 2012-12-07 DIAGNOSIS — R5381 Other malaise: Secondary | ICD-10-CM | POA: Diagnosis not present

## 2012-12-07 DIAGNOSIS — R319 Hematuria, unspecified: Secondary | ICD-10-CM | POA: Diagnosis not present

## 2012-12-07 DIAGNOSIS — N390 Urinary tract infection, site not specified: Secondary | ICD-10-CM | POA: Diagnosis not present

## 2012-12-07 DIAGNOSIS — R310 Gross hematuria: Secondary | ICD-10-CM | POA: Diagnosis not present

## 2012-12-10 DIAGNOSIS — E291 Testicular hypofunction: Secondary | ICD-10-CM | POA: Diagnosis not present

## 2012-12-11 DIAGNOSIS — R972 Elevated prostate specific antigen [PSA]: Secondary | ICD-10-CM | POA: Diagnosis not present

## 2012-12-14 DIAGNOSIS — R39198 Other difficulties with micturition: Secondary | ICD-10-CM | POA: Diagnosis not present

## 2012-12-14 DIAGNOSIS — N410 Acute prostatitis: Secondary | ICD-10-CM | POA: Diagnosis not present

## 2012-12-14 DIAGNOSIS — M129 Arthropathy, unspecified: Secondary | ICD-10-CM | POA: Diagnosis not present

## 2012-12-14 DIAGNOSIS — N400 Enlarged prostate without lower urinary tract symptoms: Secondary | ICD-10-CM | POA: Diagnosis not present

## 2012-12-15 DIAGNOSIS — R972 Elevated prostate specific antigen [PSA]: Secondary | ICD-10-CM | POA: Diagnosis not present

## 2012-12-15 DIAGNOSIS — R3129 Other microscopic hematuria: Secondary | ICD-10-CM | POA: Diagnosis not present

## 2012-12-15 DIAGNOSIS — R39198 Other difficulties with micturition: Secondary | ICD-10-CM | POA: Diagnosis not present

## 2012-12-15 DIAGNOSIS — E291 Testicular hypofunction: Secondary | ICD-10-CM | POA: Diagnosis not present

## 2012-12-21 DIAGNOSIS — N410 Acute prostatitis: Secondary | ICD-10-CM | POA: Diagnosis not present

## 2012-12-21 DIAGNOSIS — I10 Essential (primary) hypertension: Secondary | ICD-10-CM | POA: Diagnosis not present

## 2012-12-21 DIAGNOSIS — E7800 Pure hypercholesterolemia, unspecified: Secondary | ICD-10-CM | POA: Diagnosis not present

## 2012-12-22 DIAGNOSIS — R39198 Other difficulties with micturition: Secondary | ICD-10-CM | POA: Diagnosis not present

## 2012-12-22 DIAGNOSIS — N400 Enlarged prostate without lower urinary tract symptoms: Secondary | ICD-10-CM | POA: Diagnosis not present

## 2012-12-22 DIAGNOSIS — R972 Elevated prostate specific antigen [PSA]: Secondary | ICD-10-CM | POA: Diagnosis not present

## 2012-12-22 DIAGNOSIS — R3129 Other microscopic hematuria: Secondary | ICD-10-CM | POA: Diagnosis not present

## 2012-12-28 DIAGNOSIS — Z86718 Personal history of other venous thrombosis and embolism: Secondary | ICD-10-CM | POA: Diagnosis not present

## 2012-12-28 DIAGNOSIS — Z87891 Personal history of nicotine dependence: Secondary | ICD-10-CM | POA: Diagnosis not present

## 2012-12-28 DIAGNOSIS — G4733 Obstructive sleep apnea (adult) (pediatric): Secondary | ICD-10-CM | POA: Diagnosis not present

## 2012-12-28 DIAGNOSIS — R39198 Other difficulties with micturition: Secondary | ICD-10-CM | POA: Diagnosis not present

## 2012-12-28 DIAGNOSIS — F319 Bipolar disorder, unspecified: Secondary | ICD-10-CM | POA: Diagnosis not present

## 2012-12-28 DIAGNOSIS — F4310 Post-traumatic stress disorder, unspecified: Secondary | ICD-10-CM | POA: Diagnosis not present

## 2012-12-28 DIAGNOSIS — R351 Nocturia: Secondary | ICD-10-CM | POA: Diagnosis not present

## 2012-12-28 DIAGNOSIS — E119 Type 2 diabetes mellitus without complications: Secondary | ICD-10-CM | POA: Diagnosis not present

## 2012-12-28 DIAGNOSIS — Z0389 Encounter for observation for other suspected diseases and conditions ruled out: Secondary | ICD-10-CM | POA: Diagnosis not present

## 2012-12-28 DIAGNOSIS — Z23 Encounter for immunization: Secondary | ICD-10-CM | POA: Diagnosis not present

## 2012-12-28 DIAGNOSIS — N401 Enlarged prostate with lower urinary tract symptoms: Secondary | ICD-10-CM | POA: Diagnosis not present

## 2012-12-30 DIAGNOSIS — R39198 Other difficulties with micturition: Secondary | ICD-10-CM | POA: Diagnosis not present

## 2012-12-30 DIAGNOSIS — G4733 Obstructive sleep apnea (adult) (pediatric): Secondary | ICD-10-CM | POA: Diagnosis not present

## 2012-12-30 DIAGNOSIS — F4310 Post-traumatic stress disorder, unspecified: Secondary | ICD-10-CM | POA: Diagnosis not present

## 2012-12-30 DIAGNOSIS — N400 Enlarged prostate without lower urinary tract symptoms: Secondary | ICD-10-CM | POA: Diagnosis not present

## 2012-12-30 DIAGNOSIS — N401 Enlarged prostate with lower urinary tract symptoms: Secondary | ICD-10-CM | POA: Diagnosis not present

## 2012-12-30 DIAGNOSIS — E119 Type 2 diabetes mellitus without complications: Secondary | ICD-10-CM | POA: Diagnosis not present

## 2012-12-30 DIAGNOSIS — N419 Inflammatory disease of prostate, unspecified: Secondary | ICD-10-CM | POA: Diagnosis not present

## 2012-12-30 DIAGNOSIS — R351 Nocturia: Secondary | ICD-10-CM | POA: Diagnosis not present

## 2012-12-31 DIAGNOSIS — G4733 Obstructive sleep apnea (adult) (pediatric): Secondary | ICD-10-CM | POA: Diagnosis not present

## 2012-12-31 DIAGNOSIS — N401 Enlarged prostate with lower urinary tract symptoms: Secondary | ICD-10-CM | POA: Diagnosis not present

## 2012-12-31 DIAGNOSIS — F4310 Post-traumatic stress disorder, unspecified: Secondary | ICD-10-CM | POA: Diagnosis not present

## 2012-12-31 DIAGNOSIS — R351 Nocturia: Secondary | ICD-10-CM | POA: Diagnosis not present

## 2012-12-31 DIAGNOSIS — R39198 Other difficulties with micturition: Secondary | ICD-10-CM | POA: Diagnosis not present

## 2012-12-31 DIAGNOSIS — E119 Type 2 diabetes mellitus without complications: Secondary | ICD-10-CM | POA: Diagnosis not present

## 2013-01-01 DIAGNOSIS — G4733 Obstructive sleep apnea (adult) (pediatric): Secondary | ICD-10-CM | POA: Diagnosis not present

## 2013-01-01 DIAGNOSIS — N401 Enlarged prostate with lower urinary tract symptoms: Secondary | ICD-10-CM | POA: Diagnosis not present

## 2013-01-01 DIAGNOSIS — R39198 Other difficulties with micturition: Secondary | ICD-10-CM | POA: Diagnosis not present

## 2013-01-01 DIAGNOSIS — F4310 Post-traumatic stress disorder, unspecified: Secondary | ICD-10-CM | POA: Diagnosis not present

## 2013-01-01 DIAGNOSIS — E119 Type 2 diabetes mellitus without complications: Secondary | ICD-10-CM | POA: Diagnosis not present

## 2013-01-01 DIAGNOSIS — R351 Nocturia: Secondary | ICD-10-CM | POA: Diagnosis not present

## 2013-01-04 DIAGNOSIS — R972 Elevated prostate specific antigen [PSA]: Secondary | ICD-10-CM | POA: Diagnosis not present

## 2013-01-04 DIAGNOSIS — N318 Other neuromuscular dysfunction of bladder: Secondary | ICD-10-CM | POA: Diagnosis not present

## 2013-01-04 DIAGNOSIS — N390 Urinary tract infection, site not specified: Secondary | ICD-10-CM | POA: Diagnosis not present

## 2013-01-04 DIAGNOSIS — N400 Enlarged prostate without lower urinary tract symptoms: Secondary | ICD-10-CM | POA: Diagnosis not present

## 2013-01-06 DIAGNOSIS — R300 Dysuria: Secondary | ICD-10-CM | POA: Diagnosis not present

## 2013-01-06 DIAGNOSIS — T50995A Adverse effect of other drugs, medicaments and biological substances, initial encounter: Secondary | ICD-10-CM | POA: Diagnosis not present

## 2013-01-06 DIAGNOSIS — N401 Enlarged prostate with lower urinary tract symptoms: Secondary | ICD-10-CM | POA: Diagnosis not present

## 2013-01-19 DIAGNOSIS — L219 Seborrheic dermatitis, unspecified: Secondary | ICD-10-CM | POA: Diagnosis not present

## 2013-01-20 DIAGNOSIS — N390 Urinary tract infection, site not specified: Secondary | ICD-10-CM | POA: Diagnosis not present

## 2013-01-20 DIAGNOSIS — N318 Other neuromuscular dysfunction of bladder: Secondary | ICD-10-CM | POA: Diagnosis not present

## 2013-01-20 DIAGNOSIS — R972 Elevated prostate specific antigen [PSA]: Secondary | ICD-10-CM | POA: Diagnosis not present

## 2013-01-20 DIAGNOSIS — N400 Enlarged prostate without lower urinary tract symptoms: Secondary | ICD-10-CM | POA: Diagnosis not present

## 2013-02-01 DIAGNOSIS — E291 Testicular hypofunction: Secondary | ICD-10-CM | POA: Diagnosis not present

## 2013-02-10 DIAGNOSIS — M25569 Pain in unspecified knee: Secondary | ICD-10-CM | POA: Diagnosis not present

## 2013-02-18 DIAGNOSIS — R972 Elevated prostate specific antigen [PSA]: Secondary | ICD-10-CM | POA: Diagnosis not present

## 2013-02-18 DIAGNOSIS — N390 Urinary tract infection, site not specified: Secondary | ICD-10-CM | POA: Diagnosis not present

## 2013-02-18 DIAGNOSIS — N400 Enlarged prostate without lower urinary tract symptoms: Secondary | ICD-10-CM | POA: Diagnosis not present

## 2013-02-18 DIAGNOSIS — N318 Other neuromuscular dysfunction of bladder: Secondary | ICD-10-CM | POA: Diagnosis not present

## 2013-03-10 DIAGNOSIS — M1710 Unilateral primary osteoarthritis, unspecified knee: Secondary | ICD-10-CM | POA: Diagnosis not present

## 2013-03-11 DIAGNOSIS — N318 Other neuromuscular dysfunction of bladder: Secondary | ICD-10-CM | POA: Diagnosis not present

## 2013-03-11 DIAGNOSIS — R972 Elevated prostate specific antigen [PSA]: Secondary | ICD-10-CM | POA: Diagnosis not present

## 2013-03-11 DIAGNOSIS — N390 Urinary tract infection, site not specified: Secondary | ICD-10-CM | POA: Diagnosis not present

## 2013-03-11 DIAGNOSIS — N400 Enlarged prostate without lower urinary tract symptoms: Secondary | ICD-10-CM | POA: Diagnosis not present

## 2013-03-17 DIAGNOSIS — M1710 Unilateral primary osteoarthritis, unspecified knee: Secondary | ICD-10-CM | POA: Diagnosis not present

## 2013-04-02 DIAGNOSIS — M1710 Unilateral primary osteoarthritis, unspecified knee: Secondary | ICD-10-CM | POA: Diagnosis not present

## 2013-04-02 DIAGNOSIS — IMO0001 Reserved for inherently not codable concepts without codable children: Secondary | ICD-10-CM | POA: Diagnosis not present

## 2013-04-02 DIAGNOSIS — G4733 Obstructive sleep apnea (adult) (pediatric): Secondary | ICD-10-CM | POA: Diagnosis not present

## 2013-04-02 DIAGNOSIS — Z483 Aftercare following surgery for neoplasm: Secondary | ICD-10-CM | POA: Diagnosis not present

## 2013-04-02 DIAGNOSIS — IMO0002 Reserved for concepts with insufficient information to code with codable children: Secondary | ICD-10-CM | POA: Diagnosis not present

## 2013-04-02 DIAGNOSIS — Z79899 Other long term (current) drug therapy: Secondary | ICD-10-CM | POA: Diagnosis not present

## 2013-04-02 DIAGNOSIS — E7800 Pure hypercholesterolemia, unspecified: Secondary | ICD-10-CM | POA: Diagnosis not present

## 2013-04-08 DIAGNOSIS — M79609 Pain in unspecified limb: Secondary | ICD-10-CM | POA: Diagnosis not present

## 2013-04-08 DIAGNOSIS — Z01812 Encounter for preprocedural laboratory examination: Secondary | ICD-10-CM | POA: Diagnosis not present

## 2013-04-08 DIAGNOSIS — IMO0002 Reserved for concepts with insufficient information to code with codable children: Secondary | ICD-10-CM | POA: Diagnosis not present

## 2013-04-08 DIAGNOSIS — R52 Pain, unspecified: Secondary | ICD-10-CM | POA: Diagnosis not present

## 2013-04-08 DIAGNOSIS — Z79899 Other long term (current) drug therapy: Secondary | ICD-10-CM | POA: Diagnosis not present

## 2013-04-08 DIAGNOSIS — M1710 Unilateral primary osteoarthritis, unspecified knee: Secondary | ICD-10-CM | POA: Diagnosis not present

## 2013-04-08 DIAGNOSIS — Z01818 Encounter for other preprocedural examination: Secondary | ICD-10-CM | POA: Diagnosis not present

## 2013-04-13 DIAGNOSIS — N318 Other neuromuscular dysfunction of bladder: Secondary | ICD-10-CM | POA: Diagnosis not present

## 2013-04-13 DIAGNOSIS — N210 Calculus in bladder: Secondary | ICD-10-CM | POA: Diagnosis not present

## 2013-04-13 DIAGNOSIS — R972 Elevated prostate specific antigen [PSA]: Secondary | ICD-10-CM | POA: Diagnosis not present

## 2013-04-13 DIAGNOSIS — M1710 Unilateral primary osteoarthritis, unspecified knee: Secondary | ICD-10-CM | POA: Diagnosis not present

## 2013-04-13 DIAGNOSIS — N390 Urinary tract infection, site not specified: Secondary | ICD-10-CM | POA: Diagnosis not present

## 2013-04-14 DIAGNOSIS — Z23 Encounter for immunization: Secondary | ICD-10-CM | POA: Diagnosis not present

## 2013-04-14 DIAGNOSIS — G4733 Obstructive sleep apnea (adult) (pediatric): Secondary | ICD-10-CM | POA: Diagnosis not present

## 2013-04-14 DIAGNOSIS — E119 Type 2 diabetes mellitus without complications: Secondary | ICD-10-CM | POA: Diagnosis not present

## 2013-04-14 DIAGNOSIS — N138 Other obstructive and reflux uropathy: Secondary | ICD-10-CM | POA: Diagnosis not present

## 2013-04-14 DIAGNOSIS — Z79899 Other long term (current) drug therapy: Secondary | ICD-10-CM | POA: Diagnosis not present

## 2013-04-14 DIAGNOSIS — N401 Enlarged prostate with lower urinary tract symptoms: Secondary | ICD-10-CM | POA: Diagnosis not present

## 2013-04-14 DIAGNOSIS — N420 Calculus of prostate: Secondary | ICD-10-CM | POA: Diagnosis not present

## 2013-04-14 DIAGNOSIS — N400 Enlarged prostate without lower urinary tract symptoms: Secondary | ICD-10-CM | POA: Diagnosis not present

## 2013-04-14 DIAGNOSIS — E669 Obesity, unspecified: Secondary | ICD-10-CM | POA: Diagnosis not present

## 2013-04-15 DIAGNOSIS — E669 Obesity, unspecified: Secondary | ICD-10-CM | POA: Diagnosis not present

## 2013-04-15 DIAGNOSIS — E119 Type 2 diabetes mellitus without complications: Secondary | ICD-10-CM | POA: Diagnosis not present

## 2013-04-15 DIAGNOSIS — N420 Calculus of prostate: Secondary | ICD-10-CM | POA: Diagnosis not present

## 2013-04-15 DIAGNOSIS — N400 Enlarged prostate without lower urinary tract symptoms: Secondary | ICD-10-CM | POA: Diagnosis not present

## 2013-04-15 DIAGNOSIS — Z79899 Other long term (current) drug therapy: Secondary | ICD-10-CM | POA: Diagnosis not present

## 2013-04-15 DIAGNOSIS — G4733 Obstructive sleep apnea (adult) (pediatric): Secondary | ICD-10-CM | POA: Diagnosis not present

## 2013-04-19 DIAGNOSIS — R972 Elevated prostate specific antigen [PSA]: Secondary | ICD-10-CM | POA: Diagnosis not present

## 2013-04-19 DIAGNOSIS — N318 Other neuromuscular dysfunction of bladder: Secondary | ICD-10-CM | POA: Diagnosis not present

## 2013-04-19 DIAGNOSIS — N210 Calculus in bladder: Secondary | ICD-10-CM | POA: Diagnosis not present

## 2013-04-19 DIAGNOSIS — N390 Urinary tract infection, site not specified: Secondary | ICD-10-CM | POA: Diagnosis not present

## 2013-04-20 DIAGNOSIS — J209 Acute bronchitis, unspecified: Secondary | ICD-10-CM | POA: Diagnosis not present

## 2013-04-20 DIAGNOSIS — N200 Calculus of kidney: Secondary | ICD-10-CM | POA: Diagnosis not present

## 2013-04-26 DIAGNOSIS — M1710 Unilateral primary osteoarthritis, unspecified knee: Secondary | ICD-10-CM | POA: Diagnosis not present

## 2013-04-26 DIAGNOSIS — IMO0002 Reserved for concepts with insufficient information to code with codable children: Secondary | ICD-10-CM | POA: Diagnosis not present

## 2013-04-29 DIAGNOSIS — N318 Other neuromuscular dysfunction of bladder: Secondary | ICD-10-CM | POA: Diagnosis not present

## 2013-04-29 DIAGNOSIS — N390 Urinary tract infection, site not specified: Secondary | ICD-10-CM | POA: Diagnosis not present

## 2013-04-29 DIAGNOSIS — R972 Elevated prostate specific antigen [PSA]: Secondary | ICD-10-CM | POA: Diagnosis not present

## 2013-04-29 DIAGNOSIS — N210 Calculus in bladder: Secondary | ICD-10-CM | POA: Diagnosis not present

## 2013-04-30 DIAGNOSIS — R05 Cough: Secondary | ICD-10-CM | POA: Diagnosis not present

## 2013-04-30 DIAGNOSIS — R059 Cough, unspecified: Secondary | ICD-10-CM | POA: Diagnosis not present

## 2013-04-30 DIAGNOSIS — R21 Rash and other nonspecific skin eruption: Secondary | ICD-10-CM | POA: Diagnosis not present

## 2013-05-03 DIAGNOSIS — M1710 Unilateral primary osteoarthritis, unspecified knee: Secondary | ICD-10-CM | POA: Diagnosis not present

## 2013-05-03 DIAGNOSIS — H9190 Unspecified hearing loss, unspecified ear: Secondary | ICD-10-CM | POA: Diagnosis present

## 2013-05-03 DIAGNOSIS — Z96659 Presence of unspecified artificial knee joint: Secondary | ICD-10-CM | POA: Diagnosis not present

## 2013-05-03 DIAGNOSIS — Z87891 Personal history of nicotine dependence: Secondary | ICD-10-CM | POA: Diagnosis not present

## 2013-05-03 DIAGNOSIS — G4733 Obstructive sleep apnea (adult) (pediatric): Secondary | ICD-10-CM | POA: Diagnosis present

## 2013-05-03 DIAGNOSIS — Z471 Aftercare following joint replacement surgery: Secondary | ICD-10-CM | POA: Diagnosis not present

## 2013-05-03 DIAGNOSIS — E669 Obesity, unspecified: Secondary | ICD-10-CM | POA: Diagnosis present

## 2013-05-03 DIAGNOSIS — F4310 Post-traumatic stress disorder, unspecified: Secondary | ICD-10-CM | POA: Diagnosis present

## 2013-05-03 DIAGNOSIS — J40 Bronchitis, not specified as acute or chronic: Secondary | ICD-10-CM | POA: Diagnosis not present

## 2013-05-03 DIAGNOSIS — Z86718 Personal history of other venous thrombosis and embolism: Secondary | ICD-10-CM | POA: Diagnosis not present

## 2013-05-03 DIAGNOSIS — E785 Hyperlipidemia, unspecified: Secondary | ICD-10-CM | POA: Diagnosis present

## 2013-05-03 DIAGNOSIS — Z9981 Dependence on supplemental oxygen: Secondary | ICD-10-CM | POA: Diagnosis not present

## 2013-05-03 DIAGNOSIS — E119 Type 2 diabetes mellitus without complications: Secondary | ICD-10-CM | POA: Diagnosis not present

## 2013-05-03 DIAGNOSIS — Z79899 Other long term (current) drug therapy: Secondary | ICD-10-CM | POA: Diagnosis not present

## 2013-05-03 DIAGNOSIS — IMO0002 Reserved for concepts with insufficient information to code with codable children: Secondary | ICD-10-CM | POA: Diagnosis not present

## 2013-05-03 DIAGNOSIS — R262 Difficulty in walking, not elsewhere classified: Secondary | ICD-10-CM | POA: Diagnosis not present

## 2013-05-06 DIAGNOSIS — M1710 Unilateral primary osteoarthritis, unspecified knee: Secondary | ICD-10-CM | POA: Diagnosis not present

## 2013-05-06 DIAGNOSIS — IMO0002 Reserved for concepts with insufficient information to code with codable children: Secondary | ICD-10-CM | POA: Diagnosis not present

## 2013-05-06 DIAGNOSIS — E119 Type 2 diabetes mellitus without complications: Secondary | ICD-10-CM | POA: Diagnosis not present

## 2013-05-06 DIAGNOSIS — R262 Difficulty in walking, not elsewhere classified: Secondary | ICD-10-CM | POA: Diagnosis not present

## 2013-05-06 DIAGNOSIS — J40 Bronchitis, not specified as acute or chronic: Secondary | ICD-10-CM | POA: Diagnosis not present

## 2013-05-06 DIAGNOSIS — Z96659 Presence of unspecified artificial knee joint: Secondary | ICD-10-CM | POA: Diagnosis not present

## 2013-05-06 DIAGNOSIS — G8918 Other acute postprocedural pain: Secondary | ICD-10-CM | POA: Diagnosis not present

## 2013-05-06 DIAGNOSIS — E669 Obesity, unspecified: Secondary | ICD-10-CM | POA: Diagnosis not present

## 2013-05-06 DIAGNOSIS — D638 Anemia in other chronic diseases classified elsewhere: Secondary | ICD-10-CM | POA: Diagnosis not present

## 2013-05-06 DIAGNOSIS — Z471 Aftercare following joint replacement surgery: Secondary | ICD-10-CM | POA: Diagnosis not present

## 2013-05-06 DIAGNOSIS — E1159 Type 2 diabetes mellitus with other circulatory complications: Secondary | ICD-10-CM | POA: Diagnosis not present

## 2013-05-06 DIAGNOSIS — E785 Hyperlipidemia, unspecified: Secondary | ICD-10-CM | POA: Diagnosis not present

## 2013-05-08 DIAGNOSIS — D638 Anemia in other chronic diseases classified elsewhere: Secondary | ICD-10-CM | POA: Diagnosis not present

## 2013-05-08 DIAGNOSIS — Z96659 Presence of unspecified artificial knee joint: Secondary | ICD-10-CM | POA: Diagnosis not present

## 2013-05-08 DIAGNOSIS — G8918 Other acute postprocedural pain: Secondary | ICD-10-CM | POA: Diagnosis not present

## 2013-05-08 DIAGNOSIS — E1159 Type 2 diabetes mellitus with other circulatory complications: Secondary | ICD-10-CM | POA: Diagnosis not present

## 2013-05-26 DIAGNOSIS — M1710 Unilateral primary osteoarthritis, unspecified knee: Secondary | ICD-10-CM | POA: Diagnosis not present

## 2013-05-26 DIAGNOSIS — IMO0002 Reserved for concepts with insufficient information to code with codable children: Secondary | ICD-10-CM | POA: Diagnosis not present

## 2013-05-31 DIAGNOSIS — M1710 Unilateral primary osteoarthritis, unspecified knee: Secondary | ICD-10-CM | POA: Diagnosis not present

## 2013-05-31 DIAGNOSIS — M79609 Pain in unspecified limb: Secondary | ICD-10-CM | POA: Diagnosis not present

## 2013-05-31 DIAGNOSIS — IMO0002 Reserved for concepts with insufficient information to code with codable children: Secondary | ICD-10-CM | POA: Diagnosis not present

## 2013-06-02 DIAGNOSIS — IMO0001 Reserved for inherently not codable concepts without codable children: Secondary | ICD-10-CM | POA: Diagnosis not present

## 2013-06-02 DIAGNOSIS — I8310 Varicose veins of unspecified lower extremity with inflammation: Secondary | ICD-10-CM | POA: Diagnosis not present

## 2013-06-02 DIAGNOSIS — IMO0002 Reserved for concepts with insufficient information to code with codable children: Secondary | ICD-10-CM | POA: Diagnosis not present

## 2013-06-02 DIAGNOSIS — I10 Essential (primary) hypertension: Secondary | ICD-10-CM | POA: Diagnosis not present

## 2013-06-02 DIAGNOSIS — M1710 Unilateral primary osteoarthritis, unspecified knee: Secondary | ICD-10-CM | POA: Diagnosis not present

## 2013-06-03 DIAGNOSIS — IMO0002 Reserved for concepts with insufficient information to code with codable children: Secondary | ICD-10-CM | POA: Diagnosis not present

## 2013-06-03 DIAGNOSIS — M1710 Unilateral primary osteoarthritis, unspecified knee: Secondary | ICD-10-CM | POA: Diagnosis not present

## 2013-06-10 DIAGNOSIS — R972 Elevated prostate specific antigen [PSA]: Secondary | ICD-10-CM | POA: Diagnosis not present

## 2013-06-10 DIAGNOSIS — N390 Urinary tract infection, site not specified: Secondary | ICD-10-CM | POA: Diagnosis not present

## 2013-06-10 DIAGNOSIS — N400 Enlarged prostate without lower urinary tract symptoms: Secondary | ICD-10-CM | POA: Diagnosis not present

## 2013-06-10 DIAGNOSIS — N318 Other neuromuscular dysfunction of bladder: Secondary | ICD-10-CM | POA: Diagnosis not present

## 2013-06-15 DIAGNOSIS — M779 Enthesopathy, unspecified: Secondary | ICD-10-CM | POA: Diagnosis not present

## 2013-06-15 DIAGNOSIS — E119 Type 2 diabetes mellitus without complications: Secondary | ICD-10-CM | POA: Diagnosis not present

## 2013-06-15 DIAGNOSIS — M25579 Pain in unspecified ankle and joints of unspecified foot: Secondary | ICD-10-CM | POA: Diagnosis not present

## 2013-06-16 DIAGNOSIS — IMO0002 Reserved for concepts with insufficient information to code with codable children: Secondary | ICD-10-CM | POA: Diagnosis not present

## 2013-06-16 DIAGNOSIS — M1710 Unilateral primary osteoarthritis, unspecified knee: Secondary | ICD-10-CM | POA: Diagnosis not present

## 2013-06-18 DIAGNOSIS — IMO0002 Reserved for concepts with insufficient information to code with codable children: Secondary | ICD-10-CM | POA: Diagnosis not present

## 2013-06-18 DIAGNOSIS — M1710 Unilateral primary osteoarthritis, unspecified knee: Secondary | ICD-10-CM | POA: Diagnosis not present

## 2013-06-21 DIAGNOSIS — M7730 Calcaneal spur, unspecified foot: Secondary | ICD-10-CM | POA: Diagnosis not present

## 2013-06-21 DIAGNOSIS — M79609 Pain in unspecified limb: Secondary | ICD-10-CM | POA: Diagnosis not present

## 2013-06-22 DIAGNOSIS — IMO0002 Reserved for concepts with insufficient information to code with codable children: Secondary | ICD-10-CM | POA: Diagnosis not present

## 2013-06-22 DIAGNOSIS — M1710 Unilateral primary osteoarthritis, unspecified knee: Secondary | ICD-10-CM | POA: Diagnosis not present

## 2013-06-25 DIAGNOSIS — IMO0002 Reserved for concepts with insufficient information to code with codable children: Secondary | ICD-10-CM | POA: Diagnosis not present

## 2013-06-25 DIAGNOSIS — M1710 Unilateral primary osteoarthritis, unspecified knee: Secondary | ICD-10-CM | POA: Diagnosis not present

## 2013-06-30 DIAGNOSIS — IMO0002 Reserved for concepts with insufficient information to code with codable children: Secondary | ICD-10-CM | POA: Diagnosis not present

## 2013-06-30 DIAGNOSIS — M1710 Unilateral primary osteoarthritis, unspecified knee: Secondary | ICD-10-CM | POA: Diagnosis not present

## 2013-06-30 DIAGNOSIS — Z96659 Presence of unspecified artificial knee joint: Secondary | ICD-10-CM | POA: Diagnosis not present

## 2013-07-01 DIAGNOSIS — M1710 Unilateral primary osteoarthritis, unspecified knee: Secondary | ICD-10-CM | POA: Diagnosis not present

## 2013-07-01 DIAGNOSIS — IMO0002 Reserved for concepts with insufficient information to code with codable children: Secondary | ICD-10-CM | POA: Diagnosis not present

## 2013-07-01 DIAGNOSIS — E119 Type 2 diabetes mellitus without complications: Secondary | ICD-10-CM | POA: Diagnosis not present

## 2013-07-01 DIAGNOSIS — I10 Essential (primary) hypertension: Secondary | ICD-10-CM | POA: Diagnosis not present

## 2013-07-01 DIAGNOSIS — G4733 Obstructive sleep apnea (adult) (pediatric): Secondary | ICD-10-CM | POA: Diagnosis not present

## 2013-07-01 DIAGNOSIS — E7800 Pure hypercholesterolemia, unspecified: Secondary | ICD-10-CM | POA: Diagnosis not present

## 2013-07-05 DIAGNOSIS — M1710 Unilateral primary osteoarthritis, unspecified knee: Secondary | ICD-10-CM | POA: Diagnosis not present

## 2013-07-05 DIAGNOSIS — IMO0002 Reserved for concepts with insufficient information to code with codable children: Secondary | ICD-10-CM | POA: Diagnosis not present

## 2013-07-06 DIAGNOSIS — M779 Enthesopathy, unspecified: Secondary | ICD-10-CM | POA: Diagnosis not present

## 2013-07-06 DIAGNOSIS — M25579 Pain in unspecified ankle and joints of unspecified foot: Secondary | ICD-10-CM | POA: Diagnosis not present

## 2013-07-06 DIAGNOSIS — M2040 Other hammer toe(s) (acquired), unspecified foot: Secondary | ICD-10-CM | POA: Diagnosis not present

## 2013-07-07 DIAGNOSIS — IMO0002 Reserved for concepts with insufficient information to code with codable children: Secondary | ICD-10-CM | POA: Diagnosis not present

## 2013-07-07 DIAGNOSIS — M1710 Unilateral primary osteoarthritis, unspecified knee: Secondary | ICD-10-CM | POA: Diagnosis not present

## 2013-07-12 DIAGNOSIS — F3178 Bipolar disorder, in full remission, most recent episode mixed: Secondary | ICD-10-CM | POA: Diagnosis not present

## 2013-07-13 DIAGNOSIS — M1710 Unilateral primary osteoarthritis, unspecified knee: Secondary | ICD-10-CM | POA: Diagnosis not present

## 2013-07-13 DIAGNOSIS — IMO0002 Reserved for concepts with insufficient information to code with codable children: Secondary | ICD-10-CM | POA: Diagnosis not present

## 2013-07-15 DIAGNOSIS — M1710 Unilateral primary osteoarthritis, unspecified knee: Secondary | ICD-10-CM | POA: Diagnosis not present

## 2013-07-15 DIAGNOSIS — IMO0002 Reserved for concepts with insufficient information to code with codable children: Secondary | ICD-10-CM | POA: Diagnosis not present

## 2013-07-30 DIAGNOSIS — IMO0002 Reserved for concepts with insufficient information to code with codable children: Secondary | ICD-10-CM | POA: Diagnosis not present

## 2013-07-30 DIAGNOSIS — M1710 Unilateral primary osteoarthritis, unspecified knee: Secondary | ICD-10-CM | POA: Diagnosis not present

## 2013-08-03 DIAGNOSIS — IMO0002 Reserved for concepts with insufficient information to code with codable children: Secondary | ICD-10-CM | POA: Diagnosis not present

## 2013-08-03 DIAGNOSIS — M1710 Unilateral primary osteoarthritis, unspecified knee: Secondary | ICD-10-CM | POA: Diagnosis not present

## 2013-08-06 ENCOUNTER — Telehealth (HOSPITAL_COMMUNITY): Payer: Self-pay

## 2013-08-06 DIAGNOSIS — M1710 Unilateral primary osteoarthritis, unspecified knee: Secondary | ICD-10-CM | POA: Diagnosis not present

## 2013-08-06 DIAGNOSIS — IMO0002 Reserved for concepts with insufficient information to code with codable children: Secondary | ICD-10-CM | POA: Diagnosis not present

## 2013-08-06 NOTE — Telephone Encounter (Signed)
This patient is overdue for recommended follow-up with a bariatric surgeon at [facility]. Call attempted today to reestablish post-op care with [facility]. Patient reports he has had a lot going on with various other surgeries and has put this off, but does feel the need to be seen since he has had difficulty swallowing. Advised that he may need some fluid removed from his band & he was willing to try to get an appt with Mardelle MatteAndy. Attempted to transfer him directly to [place] at [facility] to schedule an appointment today but she was not available. Patient advised that he would take her contact info and look at their calendar & give her a call himself.  A letter will also be mailed to the patient today to the address [facility] advising the patient on the benefits of follow-up care and directing them to call [facility] at [phone] to schedule an appointment at their earliest convenience. Routing message to [place] at [facility] so she can follow-up.  [facility] Coordinator [phone]

## 2013-08-09 DIAGNOSIS — R609 Edema, unspecified: Secondary | ICD-10-CM | POA: Diagnosis not present

## 2013-08-09 DIAGNOSIS — M25529 Pain in unspecified elbow: Secondary | ICD-10-CM | POA: Diagnosis not present

## 2013-08-09 DIAGNOSIS — M25569 Pain in unspecified knee: Secondary | ICD-10-CM | POA: Diagnosis not present

## 2013-08-09 DIAGNOSIS — M5412 Radiculopathy, cervical region: Secondary | ICD-10-CM | POA: Diagnosis not present

## 2013-08-10 DIAGNOSIS — IMO0002 Reserved for concepts with insufficient information to code with codable children: Secondary | ICD-10-CM | POA: Diagnosis not present

## 2013-08-10 DIAGNOSIS — M1710 Unilateral primary osteoarthritis, unspecified knee: Secondary | ICD-10-CM | POA: Diagnosis not present

## 2013-08-13 DIAGNOSIS — M1710 Unilateral primary osteoarthritis, unspecified knee: Secondary | ICD-10-CM | POA: Diagnosis not present

## 2013-08-13 DIAGNOSIS — IMO0002 Reserved for concepts with insufficient information to code with codable children: Secondary | ICD-10-CM | POA: Diagnosis not present

## 2013-08-17 DIAGNOSIS — IMO0002 Reserved for concepts with insufficient information to code with codable children: Secondary | ICD-10-CM | POA: Diagnosis not present

## 2013-08-17 DIAGNOSIS — M1710 Unilateral primary osteoarthritis, unspecified knee: Secondary | ICD-10-CM | POA: Diagnosis not present

## 2013-08-20 DIAGNOSIS — M1710 Unilateral primary osteoarthritis, unspecified knee: Secondary | ICD-10-CM | POA: Diagnosis not present

## 2013-08-20 DIAGNOSIS — IMO0002 Reserved for concepts with insufficient information to code with codable children: Secondary | ICD-10-CM | POA: Diagnosis not present

## 2013-08-26 DIAGNOSIS — L255 Unspecified contact dermatitis due to plants, except food: Secondary | ICD-10-CM | POA: Diagnosis not present

## 2013-08-31 DIAGNOSIS — T360X1A Poisoning by penicillins, accidental (unintentional), initial encounter: Secondary | ICD-10-CM | POA: Diagnosis not present

## 2013-09-03 DIAGNOSIS — L255 Unspecified contact dermatitis due to plants, except food: Secondary | ICD-10-CM | POA: Diagnosis not present

## 2013-09-08 DIAGNOSIS — Z96659 Presence of unspecified artificial knee joint: Secondary | ICD-10-CM | POA: Diagnosis not present

## 2013-09-08 DIAGNOSIS — IMO0002 Reserved for concepts with insufficient information to code with codable children: Secondary | ICD-10-CM | POA: Diagnosis not present

## 2013-09-08 DIAGNOSIS — M1710 Unilateral primary osteoarthritis, unspecified knee: Secondary | ICD-10-CM | POA: Diagnosis not present

## 2013-09-30 DIAGNOSIS — IMO0001 Reserved for inherently not codable concepts without codable children: Secondary | ICD-10-CM | POA: Diagnosis not present

## 2013-09-30 DIAGNOSIS — Z125 Encounter for screening for malignant neoplasm of prostate: Secondary | ICD-10-CM | POA: Diagnosis not present

## 2013-09-30 DIAGNOSIS — J309 Allergic rhinitis, unspecified: Secondary | ICD-10-CM | POA: Diagnosis not present

## 2013-09-30 DIAGNOSIS — I10 Essential (primary) hypertension: Secondary | ICD-10-CM | POA: Diagnosis not present

## 2013-09-30 DIAGNOSIS — E7800 Pure hypercholesterolemia, unspecified: Secondary | ICD-10-CM | POA: Diagnosis not present

## 2013-09-30 DIAGNOSIS — E559 Vitamin D deficiency, unspecified: Secondary | ICD-10-CM | POA: Diagnosis not present

## 2013-10-19 DIAGNOSIS — M19079 Primary osteoarthritis, unspecified ankle and foot: Secondary | ICD-10-CM | POA: Diagnosis not present

## 2013-10-19 DIAGNOSIS — M25579 Pain in unspecified ankle and joints of unspecified foot: Secondary | ICD-10-CM | POA: Diagnosis not present

## 2013-11-02 DIAGNOSIS — M19079 Primary osteoarthritis, unspecified ankle and foot: Secondary | ICD-10-CM | POA: Diagnosis not present

## 2013-11-02 DIAGNOSIS — M25579 Pain in unspecified ankle and joints of unspecified foot: Secondary | ICD-10-CM | POA: Diagnosis not present

## 2013-11-02 DIAGNOSIS — M65979 Unspecified synovitis and tenosynovitis, unspecified ankle and foot: Secondary | ICD-10-CM | POA: Diagnosis not present

## 2013-11-02 DIAGNOSIS — M659 Synovitis and tenosynovitis, unspecified: Secondary | ICD-10-CM | POA: Diagnosis not present

## 2013-11-08 DIAGNOSIS — G5600 Carpal tunnel syndrome, unspecified upper limb: Secondary | ICD-10-CM | POA: Diagnosis not present

## 2013-11-08 DIAGNOSIS — R259 Unspecified abnormal involuntary movements: Secondary | ICD-10-CM | POA: Diagnosis not present

## 2013-11-09 DIAGNOSIS — M2140 Flat foot [pes planus] (acquired), unspecified foot: Secondary | ICD-10-CM | POA: Diagnosis not present

## 2013-11-09 DIAGNOSIS — E119 Type 2 diabetes mellitus without complications: Secondary | ICD-10-CM | POA: Diagnosis not present

## 2013-11-09 DIAGNOSIS — M959 Acquired deformity of musculoskeletal system, unspecified: Secondary | ICD-10-CM | POA: Diagnosis not present

## 2013-11-09 DIAGNOSIS — M19279 Secondary osteoarthritis, unspecified ankle and foot: Secondary | ICD-10-CM | POA: Diagnosis not present

## 2013-11-09 DIAGNOSIS — M898X9 Other specified disorders of bone, unspecified site: Secondary | ICD-10-CM | POA: Diagnosis not present

## 2013-11-13 DIAGNOSIS — M19079 Primary osteoarthritis, unspecified ankle and foot: Secondary | ICD-10-CM | POA: Diagnosis not present

## 2013-11-13 DIAGNOSIS — M25579 Pain in unspecified ankle and joints of unspecified foot: Secondary | ICD-10-CM | POA: Diagnosis not present

## 2013-11-13 DIAGNOSIS — M959 Acquired deformity of musculoskeletal system, unspecified: Secondary | ICD-10-CM | POA: Diagnosis not present

## 2013-11-13 DIAGNOSIS — Z9181 History of falling: Secondary | ICD-10-CM | POA: Diagnosis not present

## 2013-11-18 ENCOUNTER — Encounter (INDEPENDENT_AMBULATORY_CARE_PROVIDER_SITE_OTHER): Payer: Self-pay

## 2013-11-18 ENCOUNTER — Ambulatory Visit
Admission: RE | Admit: 2013-11-18 | Discharge: 2013-11-18 | Disposition: A | Discharge status: Home / Self Care | Payer: Medicare Other | Source: Ambulatory Visit | Attending: Physician Assistant | Admitting: Physician Assistant

## 2013-11-18 ENCOUNTER — Ambulatory Visit (INDEPENDENT_AMBULATORY_CARE_PROVIDER_SITE_OTHER): Payer: Medicare Other | Admitting: Physician Assistant

## 2013-11-18 VITALS — BP 156/90 | HR 84 | Ht 65.0 in | Wt 283.4 lb

## 2013-11-18 DIAGNOSIS — Z4651 Encounter for fitting and adjustment of gastric lap band: Secondary | ICD-10-CM

## 2013-11-18 DIAGNOSIS — R1310 Dysphagia, unspecified: Secondary | ICD-10-CM | POA: Diagnosis not present

## 2013-11-18 NOTE — Progress Notes (Signed)
  HISTORY: Jaime Dean is a 58 y.o.male who received an AP-Large lap-band in December 2011 by Dr. Daphine DeutscherMartin. The patient has gained five lbs since their last visit in April 2013, and has lost 20 lbs since surgery. He has had several procedures in the past 2 years including joint replacement and TURP. He comes in today complaining of close to daily solid food dysphagia for close to two years. Liquids pose little trouble. He states the swallowing issues occur regardless of time of day.  VITAL SIGNS: Filed Vitals:   11/18/13 1025  BP: 156/90  Pulse: 84    PHYSICAL EXAM: Physical exam reveals a very well-appearing 58 y.o.male in no apparent distress Neurologic: Awake, alert, oriented Psych: Bright affect, conversant Respiratory: Breathing even and unlabored. No stridor or wheezing Abdomen: Soft, nontender, nondistended to palpation. Incisions well-healed. No incisional hernias. Port easily palpated. Extremities: Atraumatic, good range of motion.  ASSESMENT: 58 y.o.  male  s/p AP-Large lap-band.   PLAN: The patient's port was accessed with a 20G Huber needle without difficulty. Clear fluid was aspirated and 6.5 mL saline was removed from the port to give a total predicted volume of 0 mL. The patient was advised to concentrate on healthy food choices and to avoid slider foods high in fats and carbohydrates. I've ordered a KUB for today to investigate position. I've advised him to focus on healthy foods. We'll have him back in two weeks, at which time I hope to get some fluid back in the band and to go over proper eating behaviors to avoid obstructive symptoms.

## 2013-11-18 NOTE — Patient Instructions (Signed)
Return in two weeks. Focus on good food choices as well as physical activity. Return sooner if you have an increase in hunger, portion sizes or weight. Return also for difficulty swallowing, night cough, reflux.   

## 2013-11-25 DIAGNOSIS — M19279 Secondary osteoarthritis, unspecified ankle and foot: Secondary | ICD-10-CM | POA: Diagnosis not present

## 2013-11-25 DIAGNOSIS — M898X9 Other specified disorders of bone, unspecified site: Secondary | ICD-10-CM | POA: Diagnosis not present

## 2013-11-25 DIAGNOSIS — M722 Plantar fascial fibromatosis: Secondary | ICD-10-CM | POA: Diagnosis not present

## 2013-11-25 DIAGNOSIS — M7750 Other enthesopathy of unspecified foot: Secondary | ICD-10-CM | POA: Diagnosis not present

## 2013-12-02 ENCOUNTER — Encounter (INDEPENDENT_AMBULATORY_CARE_PROVIDER_SITE_OTHER): Payer: Medicare Other

## 2013-12-02 DIAGNOSIS — G5620 Lesion of ulnar nerve, unspecified upper limb: Secondary | ICD-10-CM | POA: Diagnosis not present

## 2013-12-02 DIAGNOSIS — Z4651 Encounter for fitting and adjustment of gastric lap band: Secondary | ICD-10-CM | POA: Diagnosis not present

## 2013-12-02 DIAGNOSIS — G5600 Carpal tunnel syndrome, unspecified upper limb: Secondary | ICD-10-CM | POA: Diagnosis not present

## 2013-12-07 DIAGNOSIS — E291 Testicular hypofunction: Secondary | ICD-10-CM | POA: Diagnosis not present

## 2013-12-07 DIAGNOSIS — R5381 Other malaise: Secondary | ICD-10-CM | POA: Diagnosis not present

## 2013-12-07 DIAGNOSIS — R51 Headache: Secondary | ICD-10-CM | POA: Diagnosis not present

## 2013-12-07 DIAGNOSIS — IMO0001 Reserved for inherently not codable concepts without codable children: Secondary | ICD-10-CM | POA: Diagnosis not present

## 2013-12-07 DIAGNOSIS — R5383 Other fatigue: Secondary | ICD-10-CM | POA: Diagnosis not present

## 2013-12-15 DIAGNOSIS — D72829 Elevated white blood cell count, unspecified: Secondary | ICD-10-CM | POA: Diagnosis not present

## 2013-12-15 DIAGNOSIS — IMO0001 Reserved for inherently not codable concepts without codable children: Secondary | ICD-10-CM | POA: Diagnosis not present

## 2013-12-30 DIAGNOSIS — F250 Schizoaffective disorder, bipolar type: Secondary | ICD-10-CM | POA: Diagnosis not present

## 2014-01-03 DIAGNOSIS — E291 Testicular hypofunction: Secondary | ICD-10-CM | POA: Diagnosis not present

## 2014-01-03 DIAGNOSIS — R5383 Other fatigue: Secondary | ICD-10-CM | POA: Diagnosis not present

## 2014-01-03 DIAGNOSIS — M791 Myalgia: Secondary | ICD-10-CM | POA: Diagnosis not present

## 2014-01-03 DIAGNOSIS — Z23 Encounter for immunization: Secondary | ICD-10-CM | POA: Diagnosis not present

## 2014-01-03 DIAGNOSIS — R7982 Elevated C-reactive protein (CRP): Secondary | ICD-10-CM | POA: Diagnosis not present

## 2014-01-03 DIAGNOSIS — E780 Pure hypercholesterolemia: Secondary | ICD-10-CM | POA: Diagnosis not present

## 2014-01-03 DIAGNOSIS — E1165 Type 2 diabetes mellitus with hyperglycemia: Secondary | ICD-10-CM | POA: Diagnosis not present

## 2014-01-04 DIAGNOSIS — M1991 Primary osteoarthritis, unspecified site: Secondary | ICD-10-CM | POA: Diagnosis not present

## 2014-01-04 DIAGNOSIS — M722 Plantar fascial fibromatosis: Secondary | ICD-10-CM | POA: Diagnosis not present

## 2014-01-04 DIAGNOSIS — E119 Type 2 diabetes mellitus without complications: Secondary | ICD-10-CM | POA: Diagnosis not present

## 2014-01-11 DIAGNOSIS — R7982 Elevated C-reactive protein (CRP): Secondary | ICD-10-CM | POA: Diagnosis not present

## 2014-01-11 DIAGNOSIS — E1165 Type 2 diabetes mellitus with hyperglycemia: Secondary | ICD-10-CM | POA: Diagnosis not present

## 2014-01-11 DIAGNOSIS — N3090 Cystitis, unspecified without hematuria: Secondary | ICD-10-CM | POA: Diagnosis not present

## 2014-01-11 DIAGNOSIS — R972 Elevated prostate specific antigen [PSA]: Secondary | ICD-10-CM | POA: Diagnosis not present

## 2014-01-11 DIAGNOSIS — N318 Other neuromuscular dysfunction of bladder: Secondary | ICD-10-CM | POA: Diagnosis not present

## 2014-01-11 DIAGNOSIS — D72829 Elevated white blood cell count, unspecified: Secondary | ICD-10-CM | POA: Diagnosis not present

## 2014-01-11 DIAGNOSIS — R351 Nocturia: Secondary | ICD-10-CM | POA: Diagnosis not present

## 2014-01-11 DIAGNOSIS — R109 Unspecified abdominal pain: Secondary | ICD-10-CM | POA: Diagnosis not present

## 2014-01-11 DIAGNOSIS — M791 Myalgia: Secondary | ICD-10-CM | POA: Diagnosis not present

## 2014-01-18 DIAGNOSIS — E1011 Type 1 diabetes mellitus with ketoacidosis with coma: Secondary | ICD-10-CM | POA: Diagnosis not present

## 2014-01-18 DIAGNOSIS — M722 Plantar fascial fibromatosis: Secondary | ICD-10-CM | POA: Diagnosis not present

## 2014-01-18 DIAGNOSIS — M19071 Primary osteoarthritis, right ankle and foot: Secondary | ICD-10-CM | POA: Diagnosis not present

## 2014-02-04 DIAGNOSIS — E1165 Type 2 diabetes mellitus with hyperglycemia: Secondary | ICD-10-CM | POA: Diagnosis not present

## 2014-02-04 DIAGNOSIS — M353 Polymyalgia rheumatica: Secondary | ICD-10-CM | POA: Diagnosis not present

## 2014-02-17 DIAGNOSIS — R7982 Elevated C-reactive protein (CRP): Secondary | ICD-10-CM | POA: Diagnosis not present

## 2014-02-17 DIAGNOSIS — E119 Type 2 diabetes mellitus without complications: Secondary | ICD-10-CM | POA: Diagnosis not present

## 2014-02-17 DIAGNOSIS — Z1389 Encounter for screening for other disorder: Secondary | ICD-10-CM | POA: Diagnosis not present

## 2014-02-17 DIAGNOSIS — M791 Myalgia: Secondary | ICD-10-CM | POA: Diagnosis not present

## 2014-03-28 DIAGNOSIS — M2550 Pain in unspecified joint: Secondary | ICD-10-CM | POA: Diagnosis not present

## 2014-04-25 DIAGNOSIS — R3912 Poor urinary stream: Secondary | ICD-10-CM | POA: Diagnosis not present

## 2014-04-25 DIAGNOSIS — N210 Calculus in bladder: Secondary | ICD-10-CM | POA: Diagnosis not present

## 2014-04-25 DIAGNOSIS — N3090 Cystitis, unspecified without hematuria: Secondary | ICD-10-CM | POA: Diagnosis not present

## 2014-04-25 DIAGNOSIS — M129 Arthropathy, unspecified: Secondary | ICD-10-CM | POA: Diagnosis not present

## 2014-04-25 DIAGNOSIS — R310 Gross hematuria: Secondary | ICD-10-CM | POA: Diagnosis not present

## 2014-04-27 DIAGNOSIS — R3912 Poor urinary stream: Secondary | ICD-10-CM | POA: Diagnosis not present

## 2014-04-27 DIAGNOSIS — E119 Type 2 diabetes mellitus without complications: Secondary | ICD-10-CM | POA: Diagnosis not present

## 2014-04-27 DIAGNOSIS — N210 Calculus in bladder: Secondary | ICD-10-CM | POA: Diagnosis not present

## 2014-04-27 DIAGNOSIS — R310 Gross hematuria: Secondary | ICD-10-CM | POA: Diagnosis not present

## 2014-04-27 DIAGNOSIS — N329 Bladder disorder, unspecified: Secondary | ICD-10-CM | POA: Diagnosis not present

## 2014-04-27 DIAGNOSIS — Z79899 Other long term (current) drug therapy: Secondary | ICD-10-CM | POA: Diagnosis not present

## 2014-04-27 DIAGNOSIS — R109 Unspecified abdominal pain: Secondary | ICD-10-CM | POA: Diagnosis not present

## 2014-04-27 DIAGNOSIS — Z9981 Dependence on supplemental oxygen: Secondary | ICD-10-CM | POA: Diagnosis not present

## 2014-04-27 DIAGNOSIS — Z87891 Personal history of nicotine dependence: Secondary | ICD-10-CM | POA: Diagnosis not present

## 2014-04-27 DIAGNOSIS — G4733 Obstructive sleep apnea (adult) (pediatric): Secondary | ICD-10-CM | POA: Diagnosis not present

## 2014-05-03 DIAGNOSIS — R109 Unspecified abdominal pain: Secondary | ICD-10-CM | POA: Diagnosis not present

## 2014-05-03 DIAGNOSIS — N318 Other neuromuscular dysfunction of bladder: Secondary | ICD-10-CM | POA: Diagnosis not present

## 2014-05-03 DIAGNOSIS — R3915 Urgency of urination: Secondary | ICD-10-CM | POA: Diagnosis not present

## 2014-05-03 DIAGNOSIS — N3090 Cystitis, unspecified without hematuria: Secondary | ICD-10-CM | POA: Diagnosis not present

## 2014-05-05 DIAGNOSIS — R109 Unspecified abdominal pain: Secondary | ICD-10-CM | POA: Diagnosis not present

## 2014-05-05 DIAGNOSIS — N3090 Cystitis, unspecified without hematuria: Secondary | ICD-10-CM | POA: Diagnosis not present

## 2014-05-05 DIAGNOSIS — R972 Elevated prostate specific antigen [PSA]: Secondary | ICD-10-CM | POA: Diagnosis not present

## 2014-05-05 DIAGNOSIS — N210 Calculus in bladder: Secondary | ICD-10-CM | POA: Diagnosis not present

## 2014-05-05 DIAGNOSIS — R351 Nocturia: Secondary | ICD-10-CM | POA: Diagnosis not present

## 2014-05-05 DIAGNOSIS — N318 Other neuromuscular dysfunction of bladder: Secondary | ICD-10-CM | POA: Diagnosis not present

## 2014-05-17 DIAGNOSIS — M2550 Pain in unspecified joint: Secondary | ICD-10-CM | POA: Diagnosis not present

## 2014-05-17 DIAGNOSIS — E780 Pure hypercholesterolemia: Secondary | ICD-10-CM | POA: Diagnosis not present

## 2014-05-17 DIAGNOSIS — R7982 Elevated C-reactive protein (CRP): Secondary | ICD-10-CM | POA: Diagnosis not present

## 2014-05-17 DIAGNOSIS — E1165 Type 2 diabetes mellitus with hyperglycemia: Secondary | ICD-10-CM | POA: Diagnosis not present

## 2014-05-17 DIAGNOSIS — K429 Umbilical hernia without obstruction or gangrene: Secondary | ICD-10-CM | POA: Diagnosis not present

## 2014-05-24 DIAGNOSIS — Z6841 Body Mass Index (BMI) 40.0 and over, adult: Secondary | ICD-10-CM | POA: Diagnosis not present

## 2014-05-24 DIAGNOSIS — G4730 Sleep apnea, unspecified: Secondary | ICD-10-CM | POA: Diagnosis not present

## 2014-05-24 DIAGNOSIS — K429 Umbilical hernia without obstruction or gangrene: Secondary | ICD-10-CM | POA: Diagnosis not present

## 2014-06-03 DIAGNOSIS — K436 Other and unspecified ventral hernia with obstruction, without gangrene: Secondary | ICD-10-CM | POA: Diagnosis not present

## 2014-06-03 DIAGNOSIS — K439 Ventral hernia without obstruction or gangrene: Secondary | ICD-10-CM | POA: Diagnosis not present

## 2014-06-04 DIAGNOSIS — D689 Coagulation defect, unspecified: Secondary | ICD-10-CM | POA: Diagnosis present

## 2014-06-04 DIAGNOSIS — M19079 Primary osteoarthritis, unspecified ankle and foot: Secondary | ICD-10-CM | POA: Diagnosis present

## 2014-06-04 DIAGNOSIS — G8918 Other acute postprocedural pain: Secondary | ICD-10-CM | POA: Diagnosis present

## 2014-06-04 DIAGNOSIS — Z6841 Body Mass Index (BMI) 40.0 and over, adult: Secondary | ICD-10-CM | POA: Diagnosis not present

## 2014-06-04 DIAGNOSIS — Z880 Allergy status to penicillin: Secondary | ICD-10-CM | POA: Diagnosis not present

## 2014-06-04 DIAGNOSIS — Z881 Allergy status to other antibiotic agents status: Secondary | ICD-10-CM | POA: Diagnosis not present

## 2014-06-04 DIAGNOSIS — Z79899 Other long term (current) drug therapy: Secondary | ICD-10-CM | POA: Diagnosis not present

## 2014-06-04 DIAGNOSIS — K439 Ventral hernia without obstruction or gangrene: Secondary | ICD-10-CM | POA: Diagnosis not present

## 2014-06-04 DIAGNOSIS — M2140 Flat foot [pes planus] (acquired), unspecified foot: Secondary | ICD-10-CM | POA: Diagnosis present

## 2014-06-04 DIAGNOSIS — G4730 Sleep apnea, unspecified: Secondary | ICD-10-CM | POA: Diagnosis present

## 2014-06-04 DIAGNOSIS — Z882 Allergy status to sulfonamides status: Secondary | ICD-10-CM | POA: Diagnosis not present

## 2014-06-04 DIAGNOSIS — E119 Type 2 diabetes mellitus without complications: Secondary | ICD-10-CM | POA: Diagnosis present

## 2014-06-04 DIAGNOSIS — K436 Other and unspecified ventral hernia with obstruction, without gangrene: Secondary | ICD-10-CM | POA: Diagnosis present

## 2014-07-04 DIAGNOSIS — N318 Other neuromuscular dysfunction of bladder: Secondary | ICD-10-CM | POA: Diagnosis not present

## 2014-07-04 DIAGNOSIS — N3090 Cystitis, unspecified without hematuria: Secondary | ICD-10-CM | POA: Diagnosis not present

## 2014-07-04 DIAGNOSIS — R351 Nocturia: Secondary | ICD-10-CM | POA: Diagnosis not present

## 2014-07-04 DIAGNOSIS — R972 Elevated prostate specific antigen [PSA]: Secondary | ICD-10-CM | POA: Diagnosis not present

## 2014-07-04 DIAGNOSIS — R109 Unspecified abdominal pain: Secondary | ICD-10-CM | POA: Diagnosis not present

## 2014-08-23 DIAGNOSIS — F250 Schizoaffective disorder, bipolar type: Secondary | ICD-10-CM | POA: Diagnosis not present

## 2014-09-12 DIAGNOSIS — Z6841 Body Mass Index (BMI) 40.0 and over, adult: Secondary | ICD-10-CM | POA: Diagnosis not present

## 2014-09-12 DIAGNOSIS — L237 Allergic contact dermatitis due to plants, except food: Secondary | ICD-10-CM | POA: Diagnosis not present

## 2014-11-08 DIAGNOSIS — R109 Unspecified abdominal pain: Secondary | ICD-10-CM | POA: Diagnosis not present

## 2014-11-08 DIAGNOSIS — N318 Other neuromuscular dysfunction of bladder: Secondary | ICD-10-CM | POA: Diagnosis not present

## 2014-11-08 DIAGNOSIS — N210 Calculus in bladder: Secondary | ICD-10-CM | POA: Diagnosis not present

## 2014-11-08 DIAGNOSIS — R351 Nocturia: Secondary | ICD-10-CM | POA: Diagnosis not present

## 2014-11-08 DIAGNOSIS — N3091 Cystitis, unspecified with hematuria: Secondary | ICD-10-CM | POA: Diagnosis not present

## 2014-11-08 DIAGNOSIS — R312 Other microscopic hematuria: Secondary | ICD-10-CM | POA: Diagnosis not present

## 2014-11-10 DIAGNOSIS — N202 Calculus of kidney with calculus of ureter: Secondary | ICD-10-CM | POA: Diagnosis not present

## 2014-11-10 DIAGNOSIS — N200 Calculus of kidney: Secondary | ICD-10-CM | POA: Diagnosis not present

## 2014-11-10 DIAGNOSIS — R1030 Lower abdominal pain, unspecified: Secondary | ICD-10-CM | POA: Diagnosis not present

## 2014-11-10 DIAGNOSIS — N289 Disorder of kidney and ureter, unspecified: Secondary | ICD-10-CM | POA: Diagnosis not present

## 2014-11-10 DIAGNOSIS — R319 Hematuria, unspecified: Secondary | ICD-10-CM | POA: Diagnosis not present

## 2014-11-10 DIAGNOSIS — N201 Calculus of ureter: Secondary | ICD-10-CM | POA: Diagnosis not present

## 2014-11-10 DIAGNOSIS — K760 Fatty (change of) liver, not elsewhere classified: Secondary | ICD-10-CM | POA: Diagnosis not present

## 2014-11-14 DIAGNOSIS — R109 Unspecified abdominal pain: Secondary | ICD-10-CM | POA: Diagnosis not present

## 2014-11-14 DIAGNOSIS — N318 Other neuromuscular dysfunction of bladder: Secondary | ICD-10-CM | POA: Diagnosis not present

## 2014-11-14 DIAGNOSIS — N3091 Cystitis, unspecified with hematuria: Secondary | ICD-10-CM | POA: Diagnosis not present

## 2014-11-14 DIAGNOSIS — M129 Arthropathy, unspecified: Secondary | ICD-10-CM | POA: Diagnosis not present

## 2014-11-14 DIAGNOSIS — R312 Other microscopic hematuria: Secondary | ICD-10-CM | POA: Diagnosis not present

## 2014-11-14 DIAGNOSIS — R351 Nocturia: Secondary | ICD-10-CM | POA: Diagnosis not present

## 2014-11-15 DIAGNOSIS — N201 Calculus of ureter: Secondary | ICD-10-CM | POA: Diagnosis not present

## 2014-11-15 DIAGNOSIS — Z01812 Encounter for preprocedural laboratory examination: Secondary | ICD-10-CM | POA: Diagnosis not present

## 2014-11-16 DIAGNOSIS — N23 Unspecified renal colic: Secondary | ICD-10-CM | POA: Diagnosis not present

## 2014-11-16 DIAGNOSIS — G4733 Obstructive sleep apnea (adult) (pediatric): Secondary | ICD-10-CM | POA: Diagnosis not present

## 2014-11-16 DIAGNOSIS — N132 Hydronephrosis with renal and ureteral calculous obstruction: Secondary | ICD-10-CM | POA: Diagnosis not present

## 2014-11-16 DIAGNOSIS — N201 Calculus of ureter: Secondary | ICD-10-CM | POA: Diagnosis not present

## 2014-11-21 DIAGNOSIS — R972 Elevated prostate specific antigen [PSA]: Secondary | ICD-10-CM | POA: Diagnosis not present

## 2014-11-21 DIAGNOSIS — N201 Calculus of ureter: Secondary | ICD-10-CM | POA: Diagnosis not present

## 2014-11-21 DIAGNOSIS — N3090 Cystitis, unspecified without hematuria: Secondary | ICD-10-CM | POA: Diagnosis not present

## 2014-11-21 DIAGNOSIS — R351 Nocturia: Secondary | ICD-10-CM | POA: Diagnosis not present

## 2014-11-23 DIAGNOSIS — N200 Calculus of kidney: Secondary | ICD-10-CM | POA: Diagnosis not present

## 2014-11-23 DIAGNOSIS — N3090 Cystitis, unspecified without hematuria: Secondary | ICD-10-CM | POA: Diagnosis not present

## 2014-11-23 DIAGNOSIS — R109 Unspecified abdominal pain: Secondary | ICD-10-CM | POA: Diagnosis not present

## 2014-11-23 DIAGNOSIS — N201 Calculus of ureter: Secondary | ICD-10-CM | POA: Diagnosis not present

## 2014-11-24 DIAGNOSIS — N200 Calculus of kidney: Secondary | ICD-10-CM | POA: Diagnosis not present

## 2014-11-24 DIAGNOSIS — R351 Nocturia: Secondary | ICD-10-CM | POA: Diagnosis not present

## 2014-11-24 DIAGNOSIS — N3090 Cystitis, unspecified without hematuria: Secondary | ICD-10-CM | POA: Diagnosis not present

## 2014-11-24 DIAGNOSIS — R109 Unspecified abdominal pain: Secondary | ICD-10-CM | POA: Diagnosis not present

## 2014-11-25 DIAGNOSIS — Z6839 Body mass index (BMI) 39.0-39.9, adult: Secondary | ICD-10-CM | POA: Diagnosis not present

## 2014-11-25 DIAGNOSIS — N200 Calculus of kidney: Secondary | ICD-10-CM | POA: Diagnosis not present

## 2014-11-25 DIAGNOSIS — G4730 Sleep apnea, unspecified: Secondary | ICD-10-CM | POA: Diagnosis not present

## 2014-11-25 DIAGNOSIS — E669 Obesity, unspecified: Secondary | ICD-10-CM | POA: Diagnosis not present

## 2014-12-06 DIAGNOSIS — N3090 Cystitis, unspecified without hematuria: Secondary | ICD-10-CM | POA: Diagnosis not present

## 2014-12-06 DIAGNOSIS — N200 Calculus of kidney: Secondary | ICD-10-CM | POA: Diagnosis not present

## 2014-12-06 DIAGNOSIS — R109 Unspecified abdominal pain: Secondary | ICD-10-CM | POA: Diagnosis not present

## 2014-12-11 DIAGNOSIS — J209 Acute bronchitis, unspecified: Secondary | ICD-10-CM | POA: Diagnosis not present

## 2014-12-19 DIAGNOSIS — E1165 Type 2 diabetes mellitus with hyperglycemia: Secondary | ICD-10-CM | POA: Diagnosis not present

## 2014-12-19 DIAGNOSIS — G4733 Obstructive sleep apnea (adult) (pediatric): Secondary | ICD-10-CM | POA: Diagnosis not present

## 2014-12-19 DIAGNOSIS — I10 Essential (primary) hypertension: Secondary | ICD-10-CM | POA: Diagnosis not present

## 2014-12-19 DIAGNOSIS — N200 Calculus of kidney: Secondary | ICD-10-CM | POA: Diagnosis not present

## 2014-12-19 DIAGNOSIS — Z6841 Body Mass Index (BMI) 40.0 and over, adult: Secondary | ICD-10-CM | POA: Diagnosis not present

## 2014-12-20 DIAGNOSIS — N3090 Cystitis, unspecified without hematuria: Secondary | ICD-10-CM | POA: Diagnosis not present

## 2014-12-20 DIAGNOSIS — R351 Nocturia: Secondary | ICD-10-CM | POA: Diagnosis not present

## 2014-12-20 DIAGNOSIS — N200 Calculus of kidney: Secondary | ICD-10-CM | POA: Diagnosis not present

## 2014-12-20 DIAGNOSIS — R109 Unspecified abdominal pain: Secondary | ICD-10-CM | POA: Diagnosis not present

## 2014-12-21 DIAGNOSIS — F250 Schizoaffective disorder, bipolar type: Secondary | ICD-10-CM | POA: Diagnosis not present

## 2014-12-23 DIAGNOSIS — N200 Calculus of kidney: Secondary | ICD-10-CM | POA: Diagnosis not present

## 2014-12-23 DIAGNOSIS — G4730 Sleep apnea, unspecified: Secondary | ICD-10-CM | POA: Diagnosis not present

## 2014-12-23 DIAGNOSIS — E119 Type 2 diabetes mellitus without complications: Secondary | ICD-10-CM | POA: Diagnosis not present

## 2014-12-23 DIAGNOSIS — J449 Chronic obstructive pulmonary disease, unspecified: Secondary | ICD-10-CM | POA: Diagnosis not present

## 2014-12-30 DIAGNOSIS — Z6841 Body Mass Index (BMI) 40.0 and over, adult: Secondary | ICD-10-CM | POA: Diagnosis not present

## 2014-12-30 DIAGNOSIS — J441 Chronic obstructive pulmonary disease with (acute) exacerbation: Secondary | ICD-10-CM | POA: Diagnosis not present

## 2015-01-09 DIAGNOSIS — R0602 Shortness of breath: Secondary | ICD-10-CM | POA: Diagnosis not present

## 2015-01-09 DIAGNOSIS — Z6841 Body Mass Index (BMI) 40.0 and over, adult: Secondary | ICD-10-CM | POA: Diagnosis not present

## 2015-01-11 DIAGNOSIS — R079 Chest pain, unspecified: Secondary | ICD-10-CM | POA: Diagnosis not present

## 2015-01-11 DIAGNOSIS — R0602 Shortness of breath: Secondary | ICD-10-CM | POA: Diagnosis not present

## 2015-01-11 DIAGNOSIS — I82409 Acute embolism and thrombosis of unspecified deep veins of unspecified lower extremity: Secondary | ICD-10-CM | POA: Diagnosis not present

## 2015-02-01 DIAGNOSIS — Z23 Encounter for immunization: Secondary | ICD-10-CM | POA: Diagnosis not present

## 2015-02-17 DIAGNOSIS — X500XXA Overexertion from strenuous movement or load, initial encounter: Secondary | ICD-10-CM | POA: Diagnosis not present

## 2015-02-17 DIAGNOSIS — S56912A Strain of unspecified muscles, fascia and tendons at forearm level, left arm, initial encounter: Secondary | ICD-10-CM | POA: Diagnosis not present

## 2015-02-17 DIAGNOSIS — S63502A Unspecified sprain of left wrist, initial encounter: Secondary | ICD-10-CM | POA: Diagnosis not present

## 2015-02-17 DIAGNOSIS — J40 Bronchitis, not specified as acute or chronic: Secondary | ICD-10-CM | POA: Diagnosis not present

## 2015-02-17 DIAGNOSIS — E119 Type 2 diabetes mellitus without complications: Secondary | ICD-10-CM | POA: Diagnosis not present

## 2015-02-17 DIAGNOSIS — M79632 Pain in left forearm: Secondary | ICD-10-CM | POA: Diagnosis not present

## 2015-02-17 DIAGNOSIS — F329 Major depressive disorder, single episode, unspecified: Secondary | ICD-10-CM | POA: Diagnosis not present

## 2015-02-17 DIAGNOSIS — Z87891 Personal history of nicotine dependence: Secondary | ICD-10-CM | POA: Diagnosis not present

## 2015-03-02 DIAGNOSIS — M25521 Pain in right elbow: Secondary | ICD-10-CM | POA: Diagnosis not present

## 2015-03-02 DIAGNOSIS — M25532 Pain in left wrist: Secondary | ICD-10-CM | POA: Diagnosis not present

## 2015-04-06 DIAGNOSIS — M25532 Pain in left wrist: Secondary | ICD-10-CM | POA: Diagnosis not present

## 2015-04-20 DIAGNOSIS — Z6841 Body Mass Index (BMI) 40.0 and over, adult: Secondary | ICD-10-CM | POA: Diagnosis not present

## 2015-04-20 DIAGNOSIS — J019 Acute sinusitis, unspecified: Secondary | ICD-10-CM | POA: Diagnosis not present

## 2015-05-04 DIAGNOSIS — X500XXA Overexertion from strenuous movement or load, initial encounter: Secondary | ICD-10-CM | POA: Diagnosis not present

## 2015-05-04 DIAGNOSIS — S66812A Strain of other specified muscles, fascia and tendons at wrist and hand level, left hand, initial encounter: Secondary | ICD-10-CM | POA: Diagnosis not present

## 2015-05-04 DIAGNOSIS — S638X2A Sprain of other part of left wrist and hand, initial encounter: Secondary | ICD-10-CM | POA: Diagnosis not present

## 2015-05-04 DIAGNOSIS — M25532 Pain in left wrist: Secondary | ICD-10-CM | POA: Diagnosis not present

## 2015-05-08 DIAGNOSIS — S63529S Sprain of radiocarpal joint of unspecified wrist, sequela: Secondary | ICD-10-CM | POA: Diagnosis not present

## 2015-05-08 DIAGNOSIS — M25532 Pain in left wrist: Secondary | ICD-10-CM | POA: Diagnosis not present

## 2015-05-08 DIAGNOSIS — S638X2A Sprain of other part of left wrist and hand, initial encounter: Secondary | ICD-10-CM | POA: Diagnosis not present

## 2015-05-09 DIAGNOSIS — S6982XA Other specified injuries of left wrist, hand and finger(s), initial encounter: Secondary | ICD-10-CM | POA: Diagnosis not present

## 2015-05-09 DIAGNOSIS — M25332 Other instability, left wrist: Secondary | ICD-10-CM | POA: Diagnosis not present

## 2015-05-09 DIAGNOSIS — M25532 Pain in left wrist: Secondary | ICD-10-CM | POA: Diagnosis not present

## 2015-05-16 DIAGNOSIS — F250 Schizoaffective disorder, bipolar type: Secondary | ICD-10-CM | POA: Diagnosis not present

## 2015-05-31 DIAGNOSIS — M25531 Pain in right wrist: Secondary | ICD-10-CM | POA: Diagnosis not present

## 2015-06-08 DIAGNOSIS — M5412 Radiculopathy, cervical region: Secondary | ICD-10-CM | POA: Diagnosis not present

## 2015-06-13 DIAGNOSIS — E1165 Type 2 diabetes mellitus with hyperglycemia: Secondary | ICD-10-CM | POA: Diagnosis not present

## 2015-06-13 DIAGNOSIS — E559 Vitamin D deficiency, unspecified: Secondary | ICD-10-CM | POA: Diagnosis not present

## 2015-06-13 DIAGNOSIS — I10 Essential (primary) hypertension: Secondary | ICD-10-CM | POA: Diagnosis not present

## 2015-06-13 DIAGNOSIS — J309 Allergic rhinitis, unspecified: Secondary | ICD-10-CM | POA: Diagnosis not present

## 2015-06-13 DIAGNOSIS — Z125 Encounter for screening for malignant neoplasm of prostate: Secondary | ICD-10-CM | POA: Diagnosis not present

## 2015-06-13 DIAGNOSIS — E7800 Pure hypercholesterolemia, unspecified: Secondary | ICD-10-CM | POA: Diagnosis not present

## 2015-06-13 DIAGNOSIS — F319 Bipolar disorder, unspecified: Secondary | ICD-10-CM | POA: Diagnosis not present

## 2015-06-13 DIAGNOSIS — Z6841 Body Mass Index (BMI) 40.0 and over, adult: Secondary | ICD-10-CM | POA: Diagnosis not present

## 2015-06-20 DIAGNOSIS — S6982XA Other specified injuries of left wrist, hand and finger(s), initial encounter: Secondary | ICD-10-CM | POA: Diagnosis not present

## 2015-06-27 DIAGNOSIS — M19031 Primary osteoarthritis, right wrist: Secondary | ICD-10-CM | POA: Diagnosis not present

## 2015-06-27 DIAGNOSIS — G4733 Obstructive sleep apnea (adult) (pediatric): Secondary | ICD-10-CM | POA: Diagnosis not present

## 2015-06-27 DIAGNOSIS — Z9889 Other specified postprocedural states: Secondary | ICD-10-CM | POA: Diagnosis not present

## 2015-06-27 DIAGNOSIS — E119 Type 2 diabetes mellitus without complications: Secondary | ICD-10-CM | POA: Diagnosis not present

## 2015-06-27 DIAGNOSIS — S63592A Other specified sprain of left wrist, initial encounter: Secondary | ICD-10-CM | POA: Diagnosis not present

## 2015-06-27 DIAGNOSIS — Z4789 Encounter for other orthopedic aftercare: Secondary | ICD-10-CM | POA: Diagnosis not present

## 2015-06-27 DIAGNOSIS — S63512A Sprain of carpal joint of left wrist, initial encounter: Secondary | ICD-10-CM | POA: Diagnosis not present

## 2015-06-27 DIAGNOSIS — M19032 Primary osteoarthritis, left wrist: Secondary | ICD-10-CM | POA: Diagnosis not present

## 2015-06-27 DIAGNOSIS — Z79899 Other long term (current) drug therapy: Secondary | ICD-10-CM | POA: Diagnosis not present

## 2015-06-27 DIAGNOSIS — S6982XA Other specified injuries of left wrist, hand and finger(s), initial encounter: Secondary | ICD-10-CM | POA: Diagnosis not present

## 2015-06-27 DIAGNOSIS — M24131 Other articular cartilage disorders, right wrist: Secondary | ICD-10-CM | POA: Diagnosis not present

## 2015-06-27 DIAGNOSIS — Z87891 Personal history of nicotine dependence: Secondary | ICD-10-CM | POA: Diagnosis not present

## 2015-06-27 DIAGNOSIS — M1990 Unspecified osteoarthritis, unspecified site: Secondary | ICD-10-CM | POA: Diagnosis not present

## 2015-06-28 DIAGNOSIS — S6992XA Unspecified injury of left wrist, hand and finger(s), initial encounter: Secondary | ICD-10-CM | POA: Diagnosis not present

## 2015-07-05 DIAGNOSIS — M25532 Pain in left wrist: Secondary | ICD-10-CM | POA: Diagnosis not present

## 2015-07-10 DIAGNOSIS — M25532 Pain in left wrist: Secondary | ICD-10-CM | POA: Diagnosis not present

## 2015-08-02 DIAGNOSIS — M25532 Pain in left wrist: Secondary | ICD-10-CM | POA: Diagnosis not present

## 2015-08-30 DIAGNOSIS — G5601 Carpal tunnel syndrome, right upper limb: Secondary | ICD-10-CM | POA: Diagnosis not present

## 2015-08-30 DIAGNOSIS — S6982XD Other specified injuries of left wrist, hand and finger(s), subsequent encounter: Secondary | ICD-10-CM | POA: Diagnosis not present

## 2015-08-30 DIAGNOSIS — S6992XD Unspecified injury of left wrist, hand and finger(s), subsequent encounter: Secondary | ICD-10-CM | POA: Diagnosis not present

## 2015-09-29 DIAGNOSIS — B372 Candidiasis of skin and nail: Secondary | ICD-10-CM | POA: Diagnosis not present

## 2015-09-29 DIAGNOSIS — Z6841 Body Mass Index (BMI) 40.0 and over, adult: Secondary | ICD-10-CM | POA: Diagnosis not present

## 2015-10-26 DIAGNOSIS — M79644 Pain in right finger(s): Secondary | ICD-10-CM | POA: Diagnosis not present

## 2015-11-14 DIAGNOSIS — N401 Enlarged prostate with lower urinary tract symptoms: Secondary | ICD-10-CM | POA: Diagnosis not present

## 2015-11-14 DIAGNOSIS — R1084 Generalized abdominal pain: Secondary | ICD-10-CM | POA: Diagnosis not present

## 2015-11-14 DIAGNOSIS — N3020 Other chronic cystitis without hematuria: Secondary | ICD-10-CM | POA: Diagnosis not present

## 2015-11-14 DIAGNOSIS — R3915 Urgency of urination: Secondary | ICD-10-CM | POA: Diagnosis not present

## 2015-11-14 DIAGNOSIS — R351 Nocturia: Secondary | ICD-10-CM | POA: Diagnosis not present

## 2016-01-05 DIAGNOSIS — Z23 Encounter for immunization: Secondary | ICD-10-CM | POA: Diagnosis not present

## 2016-01-05 DIAGNOSIS — J309 Allergic rhinitis, unspecified: Secondary | ICD-10-CM | POA: Diagnosis not present

## 2016-01-05 DIAGNOSIS — E1165 Type 2 diabetes mellitus with hyperglycemia: Secondary | ICD-10-CM | POA: Diagnosis not present

## 2016-01-05 DIAGNOSIS — E349 Endocrine disorder, unspecified: Secondary | ICD-10-CM | POA: Diagnosis not present

## 2016-01-05 DIAGNOSIS — F319 Bipolar disorder, unspecified: Secondary | ICD-10-CM | POA: Diagnosis not present

## 2016-01-05 DIAGNOSIS — E559 Vitamin D deficiency, unspecified: Secondary | ICD-10-CM | POA: Diagnosis not present

## 2016-01-05 DIAGNOSIS — Z6841 Body Mass Index (BMI) 40.0 and over, adult: Secondary | ICD-10-CM | POA: Diagnosis not present

## 2016-01-05 DIAGNOSIS — Z79899 Other long term (current) drug therapy: Secondary | ICD-10-CM | POA: Diagnosis not present

## 2016-01-09 DIAGNOSIS — Z79899 Other long term (current) drug therapy: Secondary | ICD-10-CM | POA: Diagnosis not present

## 2016-02-09 ENCOUNTER — Encounter (HOSPITAL_COMMUNITY): Payer: Self-pay

## 2016-02-12 DIAGNOSIS — Z6841 Body Mass Index (BMI) 40.0 and over, adult: Secondary | ICD-10-CM | POA: Diagnosis not present

## 2016-02-12 DIAGNOSIS — H578 Other specified disorders of eye and adnexa: Secondary | ICD-10-CM | POA: Diagnosis not present

## 2016-02-13 DIAGNOSIS — F250 Schizoaffective disorder, bipolar type: Secondary | ICD-10-CM | POA: Diagnosis not present

## 2016-02-18 DIAGNOSIS — J189 Pneumonia, unspecified organism: Secondary | ICD-10-CM | POA: Diagnosis not present

## 2016-02-21 DIAGNOSIS — J189 Pneumonia, unspecified organism: Secondary | ICD-10-CM | POA: Diagnosis not present

## 2016-02-21 DIAGNOSIS — Z6841 Body Mass Index (BMI) 40.0 and over, adult: Secondary | ICD-10-CM | POA: Diagnosis not present

## 2016-03-04 DIAGNOSIS — R05 Cough: Secondary | ICD-10-CM | POA: Diagnosis not present

## 2016-03-04 DIAGNOSIS — J069 Acute upper respiratory infection, unspecified: Secondary | ICD-10-CM | POA: Diagnosis not present

## 2016-03-11 DIAGNOSIS — R0602 Shortness of breath: Secondary | ICD-10-CM | POA: Diagnosis not present

## 2016-03-11 DIAGNOSIS — R05 Cough: Secondary | ICD-10-CM | POA: Diagnosis not present

## 2016-06-21 DIAGNOSIS — J209 Acute bronchitis, unspecified: Secondary | ICD-10-CM | POA: Diagnosis not present

## 2016-07-06 DIAGNOSIS — J069 Acute upper respiratory infection, unspecified: Secondary | ICD-10-CM | POA: Diagnosis not present

## 2016-07-06 DIAGNOSIS — R05 Cough: Secondary | ICD-10-CM | POA: Diagnosis not present

## 2016-07-08 DIAGNOSIS — J301 Allergic rhinitis due to pollen: Secondary | ICD-10-CM | POA: Diagnosis not present

## 2016-07-08 DIAGNOSIS — R5383 Other fatigue: Secondary | ICD-10-CM | POA: Diagnosis not present

## 2016-07-08 DIAGNOSIS — J4530 Mild persistent asthma, uncomplicated: Secondary | ICD-10-CM | POA: Diagnosis not present

## 2016-07-08 DIAGNOSIS — G4733 Obstructive sleep apnea (adult) (pediatric): Secondary | ICD-10-CM | POA: Diagnosis not present

## 2016-07-11 DIAGNOSIS — F319 Bipolar disorder, unspecified: Secondary | ICD-10-CM | POA: Diagnosis not present

## 2016-07-11 DIAGNOSIS — F314 Bipolar disorder, current episode depressed, severe, without psychotic features: Secondary | ICD-10-CM | POA: Diagnosis present

## 2016-07-11 DIAGNOSIS — K219 Gastro-esophageal reflux disease without esophagitis: Secondary | ICD-10-CM | POA: Diagnosis present

## 2016-07-11 DIAGNOSIS — Z9114 Patient's other noncompliance with medication regimen: Secondary | ICD-10-CM | POA: Diagnosis not present

## 2016-07-11 DIAGNOSIS — J449 Chronic obstructive pulmonary disease, unspecified: Secondary | ICD-10-CM | POA: Diagnosis present

## 2016-07-11 DIAGNOSIS — R45851 Suicidal ideations: Secondary | ICD-10-CM | POA: Diagnosis present

## 2016-07-22 DIAGNOSIS — J309 Allergic rhinitis, unspecified: Secondary | ICD-10-CM | POA: Diagnosis not present

## 2016-07-22 DIAGNOSIS — G4733 Obstructive sleep apnea (adult) (pediatric): Secondary | ICD-10-CM | POA: Diagnosis not present

## 2016-07-22 DIAGNOSIS — Z79899 Other long term (current) drug therapy: Secondary | ICD-10-CM | POA: Diagnosis not present

## 2016-07-22 DIAGNOSIS — E119 Type 2 diabetes mellitus without complications: Secondary | ICD-10-CM | POA: Diagnosis not present

## 2016-07-22 DIAGNOSIS — E559 Vitamin D deficiency, unspecified: Secondary | ICD-10-CM | POA: Diagnosis not present

## 2016-07-22 DIAGNOSIS — Z6841 Body Mass Index (BMI) 40.0 and over, adult: Secondary | ICD-10-CM | POA: Diagnosis not present

## 2016-07-22 DIAGNOSIS — F319 Bipolar disorder, unspecified: Secondary | ICD-10-CM | POA: Diagnosis not present

## 2016-07-22 DIAGNOSIS — R6889 Other general symptoms and signs: Secondary | ICD-10-CM | POA: Diagnosis not present

## 2016-07-24 DIAGNOSIS — F250 Schizoaffective disorder, bipolar type: Secondary | ICD-10-CM | POA: Diagnosis not present

## 2016-08-19 DIAGNOSIS — H2513 Age-related nuclear cataract, bilateral: Secondary | ICD-10-CM | POA: Diagnosis not present

## 2016-08-19 DIAGNOSIS — H40033 Anatomical narrow angle, bilateral: Secondary | ICD-10-CM | POA: Diagnosis not present

## 2016-09-08 DIAGNOSIS — H1033 Unspecified acute conjunctivitis, bilateral: Secondary | ICD-10-CM | POA: Diagnosis not present

## 2016-09-13 DIAGNOSIS — M7062 Trochanteric bursitis, left hip: Secondary | ICD-10-CM | POA: Diagnosis not present

## 2016-09-13 DIAGNOSIS — Z6841 Body Mass Index (BMI) 40.0 and over, adult: Secondary | ICD-10-CM | POA: Diagnosis not present

## 2016-09-13 DIAGNOSIS — L255 Unspecified contact dermatitis due to plants, except food: Secondary | ICD-10-CM | POA: Diagnosis not present

## 2016-10-22 ENCOUNTER — Encounter (HOSPITAL_COMMUNITY): Payer: Self-pay

## 2016-10-25 DIAGNOSIS — Z6841 Body Mass Index (BMI) 40.0 and over, adult: Secondary | ICD-10-CM | POA: Diagnosis not present

## 2016-10-25 DIAGNOSIS — E1165 Type 2 diabetes mellitus with hyperglycemia: Secondary | ICD-10-CM | POA: Diagnosis not present

## 2016-10-25 DIAGNOSIS — M7062 Trochanteric bursitis, left hip: Secondary | ICD-10-CM | POA: Diagnosis not present

## 2016-12-04 DIAGNOSIS — E119 Type 2 diabetes mellitus without complications: Secondary | ICD-10-CM | POA: Diagnosis not present

## 2016-12-06 DIAGNOSIS — M25532 Pain in left wrist: Secondary | ICD-10-CM | POA: Diagnosis not present

## 2017-01-09 DIAGNOSIS — Z23 Encounter for immunization: Secondary | ICD-10-CM | POA: Diagnosis not present

## 2017-04-05 DIAGNOSIS — J209 Acute bronchitis, unspecified: Secondary | ICD-10-CM | POA: Diagnosis not present

## 2017-05-16 DIAGNOSIS — Z6841 Body Mass Index (BMI) 40.0 and over, adult: Secondary | ICD-10-CM | POA: Diagnosis not present

## 2017-05-16 DIAGNOSIS — E785 Hyperlipidemia, unspecified: Secondary | ICD-10-CM | POA: Diagnosis not present

## 2017-05-16 DIAGNOSIS — Z0000 Encounter for general adult medical examination without abnormal findings: Secondary | ICD-10-CM | POA: Diagnosis not present

## 2017-05-16 DIAGNOSIS — Z136 Encounter for screening for cardiovascular disorders: Secondary | ICD-10-CM | POA: Diagnosis not present

## 2017-05-16 DIAGNOSIS — Z79899 Other long term (current) drug therapy: Secondary | ICD-10-CM | POA: Diagnosis not present

## 2017-05-16 DIAGNOSIS — G4733 Obstructive sleep apnea (adult) (pediatric): Secondary | ICD-10-CM | POA: Diagnosis not present

## 2017-05-16 DIAGNOSIS — E7800 Pure hypercholesterolemia, unspecified: Secondary | ICD-10-CM | POA: Diagnosis not present

## 2017-05-16 DIAGNOSIS — Z23 Encounter for immunization: Secondary | ICD-10-CM | POA: Diagnosis not present

## 2017-05-16 DIAGNOSIS — E559 Vitamin D deficiency, unspecified: Secondary | ICD-10-CM | POA: Diagnosis not present

## 2017-05-16 DIAGNOSIS — E1165 Type 2 diabetes mellitus with hyperglycemia: Secondary | ICD-10-CM | POA: Diagnosis not present

## 2017-05-16 DIAGNOSIS — E669 Obesity, unspecified: Secondary | ICD-10-CM | POA: Diagnosis not present

## 2017-05-16 DIAGNOSIS — Z125 Encounter for screening for malignant neoplasm of prostate: Secondary | ICD-10-CM | POA: Diagnosis not present

## 2017-09-17 DIAGNOSIS — F250 Schizoaffective disorder, bipolar type: Secondary | ICD-10-CM | POA: Diagnosis not present

## 2017-11-20 DIAGNOSIS — Z96652 Presence of left artificial knee joint: Secondary | ICD-10-CM | POA: Diagnosis not present

## 2017-11-20 DIAGNOSIS — M7632 Iliotibial band syndrome, left leg: Secondary | ICD-10-CM | POA: Diagnosis not present

## 2017-11-25 DIAGNOSIS — E119 Type 2 diabetes mellitus without complications: Secondary | ICD-10-CM | POA: Diagnosis not present

## 2017-11-25 DIAGNOSIS — M545 Low back pain: Secondary | ICD-10-CM | POA: Diagnosis not present

## 2017-11-25 DIAGNOSIS — G4733 Obstructive sleep apnea (adult) (pediatric): Secondary | ICD-10-CM | POA: Diagnosis not present

## 2017-12-13 DIAGNOSIS — T6591XA Toxic effect of unspecified substance, accidental (unintentional), initial encounter: Secondary | ICD-10-CM | POA: Diagnosis not present

## 2017-12-13 DIAGNOSIS — T22511A Corrosion of first degree of right forearm, initial encounter: Secondary | ICD-10-CM | POA: Diagnosis not present

## 2017-12-25 DIAGNOSIS — Z23 Encounter for immunization: Secondary | ICD-10-CM | POA: Diagnosis not present

## 2018-01-17 DIAGNOSIS — J069 Acute upper respiratory infection, unspecified: Secondary | ICD-10-CM | POA: Diagnosis not present

## 2018-01-26 DIAGNOSIS — M79671 Pain in right foot: Secondary | ICD-10-CM | POA: Diagnosis not present

## 2018-02-10 DIAGNOSIS — E119 Type 2 diabetes mellitus without complications: Secondary | ICD-10-CM | POA: Diagnosis not present

## 2018-02-10 DIAGNOSIS — Z87891 Personal history of nicotine dependence: Secondary | ICD-10-CM | POA: Diagnosis not present

## 2018-02-10 DIAGNOSIS — S4992XA Unspecified injury of left shoulder and upper arm, initial encounter: Secondary | ICD-10-CM | POA: Diagnosis not present

## 2018-02-10 DIAGNOSIS — S46912A Strain of unspecified muscle, fascia and tendon at shoulder and upper arm level, left arm, initial encounter: Secondary | ICD-10-CM | POA: Diagnosis not present

## 2018-02-19 DIAGNOSIS — M19012 Primary osteoarthritis, left shoulder: Secondary | ICD-10-CM | POA: Diagnosis not present

## 2018-03-05 DIAGNOSIS — G44309 Post-traumatic headache, unspecified, not intractable: Secondary | ICD-10-CM | POA: Diagnosis not present

## 2018-03-05 DIAGNOSIS — S0990XA Unspecified injury of head, initial encounter: Secondary | ICD-10-CM | POA: Diagnosis not present

## 2018-03-05 DIAGNOSIS — Z9181 History of falling: Secondary | ICD-10-CM | POA: Diagnosis not present

## 2018-03-05 DIAGNOSIS — R51 Headache: Secondary | ICD-10-CM | POA: Diagnosis not present

## 2018-03-05 DIAGNOSIS — R42 Dizziness and giddiness: Secondary | ICD-10-CM | POA: Diagnosis not present

## 2018-03-12 DIAGNOSIS — G44309 Post-traumatic headache, unspecified, not intractable: Secondary | ICD-10-CM | POA: Diagnosis not present

## 2018-03-12 DIAGNOSIS — R42 Dizziness and giddiness: Secondary | ICD-10-CM | POA: Diagnosis not present

## 2018-03-12 DIAGNOSIS — Z6841 Body Mass Index (BMI) 40.0 and over, adult: Secondary | ICD-10-CM | POA: Diagnosis not present

## 2018-04-16 DIAGNOSIS — M19012 Primary osteoarthritis, left shoulder: Secondary | ICD-10-CM | POA: Diagnosis not present

## 2018-05-14 DIAGNOSIS — E559 Vitamin D deficiency, unspecified: Secondary | ICD-10-CM | POA: Diagnosis not present

## 2018-05-14 DIAGNOSIS — G4733 Obstructive sleep apnea (adult) (pediatric): Secondary | ICD-10-CM | POA: Diagnosis not present

## 2018-05-14 DIAGNOSIS — J309 Allergic rhinitis, unspecified: Secondary | ICD-10-CM | POA: Diagnosis not present

## 2018-05-14 DIAGNOSIS — Z125 Encounter for screening for malignant neoplasm of prostate: Secondary | ICD-10-CM | POA: Diagnosis not present

## 2018-05-14 DIAGNOSIS — E1165 Type 2 diabetes mellitus with hyperglycemia: Secondary | ICD-10-CM | POA: Diagnosis not present

## 2018-06-01 DIAGNOSIS — H40033 Anatomical narrow angle, bilateral: Secondary | ICD-10-CM | POA: Diagnosis not present

## 2018-06-01 DIAGNOSIS — H2513 Age-related nuclear cataract, bilateral: Secondary | ICD-10-CM | POA: Diagnosis not present

## 2018-06-24 DIAGNOSIS — E119 Type 2 diabetes mellitus without complications: Secondary | ICD-10-CM | POA: Diagnosis not present

## 2018-07-01 DIAGNOSIS — F250 Schizoaffective disorder, bipolar type: Secondary | ICD-10-CM | POA: Diagnosis not present

## 2018-07-09 DIAGNOSIS — L309 Dermatitis, unspecified: Secondary | ICD-10-CM | POA: Diagnosis not present

## 2018-08-13 DIAGNOSIS — E1165 Type 2 diabetes mellitus with hyperglycemia: Secondary | ICD-10-CM | POA: Diagnosis not present

## 2018-08-13 DIAGNOSIS — E7800 Pure hypercholesterolemia, unspecified: Secondary | ICD-10-CM | POA: Diagnosis not present

## 2018-08-13 DIAGNOSIS — I10 Essential (primary) hypertension: Secondary | ICD-10-CM | POA: Diagnosis not present

## 2018-09-16 DIAGNOSIS — Z6841 Body Mass Index (BMI) 40.0 and over, adult: Secondary | ICD-10-CM | POA: Diagnosis not present

## 2018-09-16 DIAGNOSIS — E1165 Type 2 diabetes mellitus with hyperglycemia: Secondary | ICD-10-CM | POA: Diagnosis not present

## 2018-11-19 DIAGNOSIS — Z6841 Body Mass Index (BMI) 40.0 and over, adult: Secondary | ICD-10-CM | POA: Diagnosis not present

## 2018-11-19 DIAGNOSIS — E1165 Type 2 diabetes mellitus with hyperglycemia: Secondary | ICD-10-CM | POA: Diagnosis not present

## 2018-11-19 DIAGNOSIS — I10 Essential (primary) hypertension: Secondary | ICD-10-CM | POA: Diagnosis not present

## 2018-11-19 DIAGNOSIS — E7800 Pure hypercholesterolemia, unspecified: Secondary | ICD-10-CM | POA: Diagnosis not present

## 2019-01-13 DIAGNOSIS — Z23 Encounter for immunization: Secondary | ICD-10-CM | POA: Diagnosis not present

## 2019-01-22 DIAGNOSIS — Z6841 Body Mass Index (BMI) 40.0 and over, adult: Secondary | ICD-10-CM | POA: Diagnosis not present

## 2019-01-22 DIAGNOSIS — L03211 Cellulitis of face: Secondary | ICD-10-CM | POA: Diagnosis not present

## 2019-01-27 DIAGNOSIS — F250 Schizoaffective disorder, bipolar type: Secondary | ICD-10-CM | POA: Diagnosis not present

## 2019-02-05 DIAGNOSIS — M792 Neuralgia and neuritis, unspecified: Secondary | ICD-10-CM | POA: Diagnosis not present

## 2019-02-05 DIAGNOSIS — Z6841 Body Mass Index (BMI) 40.0 and over, adult: Secondary | ICD-10-CM | POA: Diagnosis not present

## 2019-03-01 DIAGNOSIS — R519 Headache, unspecified: Secondary | ICD-10-CM | POA: Diagnosis not present

## 2019-03-01 DIAGNOSIS — Z6841 Body Mass Index (BMI) 40.0 and over, adult: Secondary | ICD-10-CM | POA: Diagnosis not present

## 2019-03-01 DIAGNOSIS — S060X9A Concussion with loss of consciousness of unspecified duration, initial encounter: Secondary | ICD-10-CM | POA: Diagnosis not present

## 2019-03-03 DIAGNOSIS — R519 Headache, unspecified: Secondary | ICD-10-CM | POA: Diagnosis not present

## 2019-03-03 DIAGNOSIS — S060X9A Concussion with loss of consciousness of unspecified duration, initial encounter: Secondary | ICD-10-CM | POA: Diagnosis not present

## 2019-03-03 DIAGNOSIS — S0990XA Unspecified injury of head, initial encounter: Secondary | ICD-10-CM | POA: Diagnosis not present

## 2019-03-04 DIAGNOSIS — G44321 Chronic post-traumatic headache, intractable: Secondary | ICD-10-CM | POA: Diagnosis not present

## 2019-03-04 DIAGNOSIS — M795 Residual foreign body in soft tissue: Secondary | ICD-10-CM | POA: Diagnosis not present

## 2019-04-15 DIAGNOSIS — M795 Residual foreign body in soft tissue: Secondary | ICD-10-CM | POA: Diagnosis not present

## 2019-04-16 DIAGNOSIS — M795 Residual foreign body in soft tissue: Secondary | ICD-10-CM | POA: Diagnosis not present

## 2019-04-16 DIAGNOSIS — Z1152 Encounter for screening for COVID-19: Secondary | ICD-10-CM | POA: Diagnosis not present

## 2019-04-16 DIAGNOSIS — Z01812 Encounter for preprocedural laboratory examination: Secondary | ICD-10-CM | POA: Diagnosis not present

## 2019-04-16 DIAGNOSIS — Z20822 Contact with and (suspected) exposure to covid-19: Secondary | ICD-10-CM | POA: Diagnosis not present

## 2019-04-19 DIAGNOSIS — Z01818 Encounter for other preprocedural examination: Secondary | ICD-10-CM | POA: Diagnosis not present

## 2019-04-23 DIAGNOSIS — H9193 Unspecified hearing loss, bilateral: Secondary | ICD-10-CM | POA: Diagnosis not present

## 2019-04-23 DIAGNOSIS — G4733 Obstructive sleep apnea (adult) (pediatric): Secondary | ICD-10-CM | POA: Diagnosis not present

## 2019-04-23 DIAGNOSIS — S0184XA Puncture wound with foreign body of other part of head, initial encounter: Secondary | ICD-10-CM | POA: Diagnosis not present

## 2019-04-23 DIAGNOSIS — E119 Type 2 diabetes mellitus without complications: Secondary | ICD-10-CM | POA: Diagnosis not present

## 2019-04-23 DIAGNOSIS — I10 Essential (primary) hypertension: Secondary | ICD-10-CM | POA: Diagnosis not present

## 2019-04-23 DIAGNOSIS — M795 Residual foreign body in soft tissue: Secondary | ICD-10-CM | POA: Diagnosis not present

## 2019-04-23 DIAGNOSIS — Z87891 Personal history of nicotine dependence: Secondary | ICD-10-CM | POA: Diagnosis not present

## 2019-04-23 DIAGNOSIS — Z9989 Dependence on other enabling machines and devices: Secondary | ICD-10-CM | POA: Diagnosis not present

## 2019-04-23 DIAGNOSIS — Z7984 Long term (current) use of oral hypoglycemic drugs: Secondary | ICD-10-CM | POA: Diagnosis not present

## 2019-04-23 DIAGNOSIS — Z86718 Personal history of other venous thrombosis and embolism: Secondary | ICD-10-CM | POA: Diagnosis not present

## 2019-04-23 DIAGNOSIS — S0085XA Superficial foreign body of other part of head, initial encounter: Secondary | ICD-10-CM | POA: Diagnosis not present

## 2019-05-21 DIAGNOSIS — E119 Type 2 diabetes mellitus without complications: Secondary | ICD-10-CM | POA: Insufficient documentation

## 2019-05-21 DIAGNOSIS — H4020X0 Unspecified primary angle-closure glaucoma, stage unspecified: Secondary | ICD-10-CM | POA: Insufficient documentation

## 2019-05-21 DIAGNOSIS — F319 Bipolar disorder, unspecified: Secondary | ICD-10-CM | POA: Insufficient documentation

## 2019-05-24 DIAGNOSIS — E782 Mixed hyperlipidemia: Secondary | ICD-10-CM | POA: Diagnosis not present

## 2019-05-24 DIAGNOSIS — E119 Type 2 diabetes mellitus without complications: Secondary | ICD-10-CM | POA: Diagnosis not present

## 2019-05-24 DIAGNOSIS — Z87891 Personal history of nicotine dependence: Secondary | ICD-10-CM | POA: Diagnosis not present

## 2019-05-24 DIAGNOSIS — I10 Essential (primary) hypertension: Secondary | ICD-10-CM | POA: Diagnosis not present

## 2019-05-24 DIAGNOSIS — Z7984 Long term (current) use of oral hypoglycemic drugs: Secondary | ICD-10-CM | POA: Diagnosis not present

## 2019-05-24 DIAGNOSIS — F4310 Post-traumatic stress disorder, unspecified: Secondary | ICD-10-CM | POA: Insufficient documentation

## 2019-07-06 DIAGNOSIS — F0781 Postconcussional syndrome: Secondary | ICD-10-CM | POA: Diagnosis not present

## 2019-07-06 DIAGNOSIS — M79601 Pain in right arm: Secondary | ICD-10-CM | POA: Diagnosis not present

## 2019-07-06 DIAGNOSIS — M542 Cervicalgia: Secondary | ICD-10-CM | POA: Diagnosis not present

## 2019-07-06 DIAGNOSIS — G44309 Post-traumatic headache, unspecified, not intractable: Secondary | ICD-10-CM | POA: Diagnosis not present

## 2019-07-06 DIAGNOSIS — G44321 Chronic post-traumatic headache, intractable: Secondary | ICD-10-CM | POA: Diagnosis not present

## 2019-07-14 DIAGNOSIS — Z9181 History of falling: Secondary | ICD-10-CM | POA: Diagnosis not present

## 2019-07-14 DIAGNOSIS — J309 Allergic rhinitis, unspecified: Secondary | ICD-10-CM | POA: Diagnosis not present

## 2019-07-14 DIAGNOSIS — Z6841 Body Mass Index (BMI) 40.0 and over, adult: Secondary | ICD-10-CM | POA: Diagnosis not present

## 2019-07-26 DIAGNOSIS — E782 Mixed hyperlipidemia: Secondary | ICD-10-CM | POA: Diagnosis not present

## 2019-08-29 DIAGNOSIS — J0100 Acute maxillary sinusitis, unspecified: Secondary | ICD-10-CM | POA: Diagnosis not present

## 2019-09-03 DIAGNOSIS — J208 Acute bronchitis due to other specified organisms: Secondary | ICD-10-CM | POA: Diagnosis not present

## 2019-09-03 DIAGNOSIS — E782 Mixed hyperlipidemia: Secondary | ICD-10-CM | POA: Diagnosis not present

## 2019-09-03 DIAGNOSIS — B9689 Other specified bacterial agents as the cause of diseases classified elsewhere: Secondary | ICD-10-CM | POA: Diagnosis not present

## 2019-09-03 DIAGNOSIS — J019 Acute sinusitis, unspecified: Secondary | ICD-10-CM | POA: Diagnosis not present

## 2019-11-03 DIAGNOSIS — F250 Schizoaffective disorder, bipolar type: Secondary | ICD-10-CM | POA: Diagnosis not present

## 2019-11-08 ENCOUNTER — Ambulatory Visit (INDEPENDENT_AMBULATORY_CARE_PROVIDER_SITE_OTHER): Payer: Medicare Other | Admitting: Otolaryngology

## 2019-11-08 ENCOUNTER — Other Ambulatory Visit: Payer: Self-pay

## 2019-11-08 ENCOUNTER — Encounter (INDEPENDENT_AMBULATORY_CARE_PROVIDER_SITE_OTHER): Payer: Self-pay | Admitting: Otolaryngology

## 2019-11-08 VITALS — Temp 97.3°F

## 2019-11-08 DIAGNOSIS — H903 Sensorineural hearing loss, bilateral: Secondary | ICD-10-CM | POA: Diagnosis not present

## 2019-11-08 DIAGNOSIS — H9313 Tinnitus, bilateral: Secondary | ICD-10-CM | POA: Diagnosis not present

## 2019-11-08 DIAGNOSIS — H6122 Impacted cerumen, left ear: Secondary | ICD-10-CM | POA: Diagnosis not present

## 2019-11-08 NOTE — Progress Notes (Signed)
HPI: Jaime Dean is a 64 y.o. male who presents for evaluation of hearing loss.  Patient was in the service for over 20years.  I had seen the patient in 2003 and at that time patient had a mild bilateral symmetric SNHL of approximately 25-30 dB that was downsloping in the upper frequencies.  Over the past 10 years he has noticed gradual decline in hearing especially on the left side. He got hearing aids from the [place] system but did not tolerate these well and does not wear them.  Past Medical History:  Diagnosis Date  . Bipolar 1 disorder (HCC)   . Diabetes mellitus type II   . Glaucoma   . Hearing loss   . History of blood clots   . Manic depression (HCC)   . Plantar fasciitis   . Pneumonia   . Post traumatic stress disorder (PTSD)   . Sleep apnea   . Wears glasses    Past Surgical History:  Procedure Laterality Date  . COLLATERAL LIGAMENT REPAIR, KNEE    . KNEE ARTHROSCOPY     right knee   Social History   Socioeconomic History  . Marital status: Married    Spouse name: Not on file  . Number of children: Not on file  . Years of education: Not on file  . Highest education level: Not on file  Occupational History  . Not on file  Tobacco Use  . Smoking status: Former Smoker    Quit date: 2005    Years since quitting: 16.6  . Smokeless tobacco: Never Used  . Tobacco comment: light smoker/ off and on  Substance and Sexual Activity  . Alcohol use: No  . Drug use: No  . Sexual activity: Not on file  Other Topics Concern  . Not on file  Social History Narrative  . Not on file   Social Determinants of Health   Financial Resource Strain:   . Difficulty of Paying Living Expenses:   Food Insecurity:   . Worried About Programme researcher, broadcasting/film/video in the Last Year:   . Barista in the Last Year:   Transportation Needs:   . Freight forwarder (Medical):   Marland Kitchen Lack of Transportation (Non-Medical):   Physical Activity:   . Days of Exercise per Week:   . Minutes of  Exercise per Session:   Stress:   . Feeling of Stress :   Social Connections:   . Frequency of Communication with Friends and Family:   . Frequency of Social Gatherings with Friends and Family:   . Attends Religious Services:   . Active Member of Clubs or Organizations:   . Attends Banker Meetings:   Marland Kitchen Marital Status:    No family history on file. Allergies  Allergen Reactions  . Penicillins Anaphylaxis  . Avelox [Moxifloxacin Hcl In Nacl]   . Sulfa Antibiotics Swelling   Prior to Admission medications   Medication Sig Start Date End Date Taking? Authorizing Provider  FLUoxetine (PROZAC) 20 MG tablet Take 20 mg by mouth daily.     Yes [provider]  glipiZIDE (GLUCOTROL XL) 10 MG 24 hr tablet Take 10 mg by mouth.   Yes [provider]  Iloperidone (FANAPT) 6 MG TABS Take by mouth at bedtime.     Yes [provider]  metFORMIN (GLUMETZA) 1000 MG (MOD) 24 hr tablet Take 1,000 mg by mouth daily with breakfast.     Yes [provider]  multivitamin-iron-minerals-folic  acid (CENTRUM) chewable tablet Chew 1 tablet by mouth daily.     Yes [provider]  rosuvastatin (CRESTOR) 20 MG tablet Take 20 mg by mouth Nightly.     Yes [provider]  Valproic Acid (STAVZOR) 500 MG CPDR Take by mouth.     Yes [provider]     Positive ROS: Otherwise negative  All other systems have been reviewed and were otherwise negative with the exception of those mentioned in the HPI and as above.  Physical Exam: Constitutional: Alert, well-appearing, no acute distress Ears: External ears without lesions or tenderness.  Right ear canal and TM are clear.  Left ear canal reveals a moderate amount of wax that was removed with forceps.  The left TM is clear. Nasal: External nose without lesions.. Clear nasal passages Oral: Lips and gums without lesions. Tongue and palate mucosa without lesions. Posterior oropharynx clear. Neck:  No palpable adenopathy or masses Respiratory: Breathing comfortably  Skin: No facial/neck lesions or rash noted.  Cerumen impaction removal  Date/Time: 11/08/2019 1:34 PM Performed by: Jaime Halon, MD Authorized by: Jaime Halon, MD   Consent:    Consent obtained:  Verbal   Consent given by:  Patient   Risks discussed:  Pain and bleeding Procedure details:    Location:  L ear   Procedure type: curette and forceps   Post-procedure details:    Inspection:  TM intact and canal normal   Hearing quality:  Improved   Patient tolerance of procedure:  Tolerated well, no immediate complications Comments:     TMs are clear bilaterally.  Audiologic testing revealed bilateral moderate to severe sensorineural hearing loss in both ears slightly worse in the low and mid frequencies in the left ear.  He had type A tympanograms bilaterally.  Assessment: Wax buildup on the left side. Moderate to severe bilateral SNHL worse on the left side which has been a gradual process over the past 15 years.  Plan: He would be a candidate for hearing aids and discussed this with patient and his wife. He is presently not using hearing aids and I gave him a prescription for use of a caption call phone. He will work with hearing life concerning obtaining hearing aids.  Narda Bonds, MD

## 2019-11-10 ENCOUNTER — Encounter (INDEPENDENT_AMBULATORY_CARE_PROVIDER_SITE_OTHER): Payer: Self-pay

## 2019-11-11 DIAGNOSIS — Z20828 Contact with and (suspected) exposure to other viral communicable diseases: Secondary | ICD-10-CM | POA: Diagnosis not present

## 2019-11-11 DIAGNOSIS — J3489 Other specified disorders of nose and nasal sinuses: Secondary | ICD-10-CM | POA: Diagnosis not present

## 2019-11-26 DIAGNOSIS — E119 Type 2 diabetes mellitus without complications: Secondary | ICD-10-CM | POA: Diagnosis not present

## 2019-11-26 DIAGNOSIS — E782 Mixed hyperlipidemia: Secondary | ICD-10-CM | POA: Diagnosis not present

## 2019-11-26 DIAGNOSIS — Z23 Encounter for immunization: Secondary | ICD-10-CM | POA: Diagnosis not present

## 2019-11-26 DIAGNOSIS — I10 Essential (primary) hypertension: Secondary | ICD-10-CM | POA: Diagnosis not present

## 2019-11-26 DIAGNOSIS — Z7984 Long term (current) use of oral hypoglycemic drugs: Secondary | ICD-10-CM | POA: Diagnosis not present

## 2019-11-30 DIAGNOSIS — H2513 Age-related nuclear cataract, bilateral: Secondary | ICD-10-CM | POA: Diagnosis not present

## 2019-11-30 DIAGNOSIS — H40033 Anatomical narrow angle, bilateral: Secondary | ICD-10-CM | POA: Diagnosis not present

## 2020-01-18 DIAGNOSIS — Z5321 Procedure and treatment not carried out due to patient leaving prior to being seen by health care provider: Secondary | ICD-10-CM | POA: Diagnosis not present

## 2020-01-18 DIAGNOSIS — U071 COVID-19: Secondary | ICD-10-CM | POA: Diagnosis not present

## 2020-01-19 DIAGNOSIS — R531 Weakness: Secondary | ICD-10-CM | POA: Diagnosis not present

## 2020-01-19 DIAGNOSIS — R0789 Other chest pain: Secondary | ICD-10-CM | POA: Diagnosis not present

## 2020-01-19 DIAGNOSIS — Z87891 Personal history of nicotine dependence: Secondary | ICD-10-CM | POA: Diagnosis not present

## 2020-01-19 DIAGNOSIS — R197 Diarrhea, unspecified: Secondary | ICD-10-CM | POA: Diagnosis not present

## 2020-01-19 DIAGNOSIS — U071 COVID-19: Secondary | ICD-10-CM | POA: Diagnosis not present

## 2020-02-02 DIAGNOSIS — G4733 Obstructive sleep apnea (adult) (pediatric): Secondary | ICD-10-CM | POA: Diagnosis not present

## 2020-02-03 DIAGNOSIS — F4312 Post-traumatic stress disorder, chronic: Secondary | ICD-10-CM | POA: Diagnosis not present

## 2020-02-03 DIAGNOSIS — F250 Schizoaffective disorder, bipolar type: Secondary | ICD-10-CM | POA: Diagnosis not present

## 2020-03-14 DIAGNOSIS — M25552 Pain in left hip: Secondary | ICD-10-CM | POA: Diagnosis not present

## 2020-03-14 DIAGNOSIS — I878 Other specified disorders of veins: Secondary | ICD-10-CM | POA: Diagnosis not present

## 2020-03-14 DIAGNOSIS — G8929 Other chronic pain: Secondary | ICD-10-CM | POA: Diagnosis not present

## 2020-03-14 DIAGNOSIS — M160 Bilateral primary osteoarthritis of hip: Secondary | ICD-10-CM | POA: Diagnosis not present

## 2020-03-14 DIAGNOSIS — M25551 Pain in right hip: Secondary | ICD-10-CM | POA: Diagnosis not present

## 2020-05-10 DIAGNOSIS — F250 Schizoaffective disorder, bipolar type: Secondary | ICD-10-CM | POA: Diagnosis not present

## 2020-05-12 DIAGNOSIS — E119 Type 2 diabetes mellitus without complications: Secondary | ICD-10-CM | POA: Diagnosis not present

## 2020-05-12 DIAGNOSIS — Z0000 Encounter for general adult medical examination without abnormal findings: Secondary | ICD-10-CM | POA: Diagnosis not present

## 2020-05-12 DIAGNOSIS — E782 Mixed hyperlipidemia: Secondary | ICD-10-CM | POA: Diagnosis not present

## 2021-04-06 ENCOUNTER — Other Ambulatory Visit: Payer: Self-pay | Admitting: Otolaryngology

## 2021-04-06 DIAGNOSIS — Z6841 Body Mass Index (BMI) 40.0 and over, adult: Secondary | ICD-10-CM

## 2021-04-06 DIAGNOSIS — J039 Acute tonsillitis, unspecified: Secondary | ICD-10-CM

## 2021-04-06 DIAGNOSIS — H9193 Unspecified hearing loss, bilateral: Secondary | ICD-10-CM

## 2021-04-06 DIAGNOSIS — R590 Localized enlarged lymph nodes: Secondary | ICD-10-CM

## 2021-05-03 ENCOUNTER — Ambulatory Visit
Admission: RE | Admit: 2021-05-03 | Discharge: 2021-05-03 | Disposition: A | Discharge status: Home / Self Care | Payer: Medicare Other | Source: Ambulatory Visit | Attending: Otolaryngology | Admitting: Otolaryngology

## 2021-05-03 DIAGNOSIS — Z6841 Body Mass Index (BMI) 40.0 and over, adult: Secondary | ICD-10-CM

## 2021-05-03 DIAGNOSIS — J039 Acute tonsillitis, unspecified: Secondary | ICD-10-CM

## 2021-05-03 DIAGNOSIS — H9193 Unspecified hearing loss, bilateral: Secondary | ICD-10-CM

## 2021-05-03 DIAGNOSIS — R590 Localized enlarged lymph nodes: Secondary | ICD-10-CM

## 2021-05-03 MED ORDER — IOPAMIDOL (ISOVUE-300) INJECTION 61%
75.0000 mL | Freq: Once | INTRAVENOUS | Status: AC | PRN
  Administered 2021-05-03: 75 mL via INTRAVENOUS

## 2022-04-18 ENCOUNTER — Other Ambulatory Visit: Payer: Self-pay | Admitting: Student in an Organized Health Care Education/Training Program

## 2022-04-18 DIAGNOSIS — M25532 Pain in left wrist: Secondary | ICD-10-CM

## 2022-05-01 ENCOUNTER — Inpatient Hospital Stay: Admission: RE | Admit: 2022-05-01 | Payer: Medicare Other | Source: Ambulatory Visit

## 2022-05-05 ENCOUNTER — Ambulatory Visit
Admission: RE | Admit: 2022-05-05 | Discharge: 2022-05-05 | Disposition: A | Discharge status: Home / Self Care | Payer: Medicare Other | Source: Ambulatory Visit | Attending: Student in an Organized Health Care Education/Training Program | Admitting: Student in an Organized Health Care Education/Training Program

## 2022-05-05 DIAGNOSIS — M25532 Pain in left wrist: Secondary | ICD-10-CM

## 2022-05-12 ENCOUNTER — Other Ambulatory Visit: Payer: Medicare Other

## 2022-09-27 IMAGING — CT CT NECK W/ CM
5 of 6 series · 15 of 33 positions shown, 17 images · IV contrast (agent unspecified)
Comparison: None.

CLINICAL DATA: Tooth infection.  Tonsillitis.

Creatinine was obtained on site at [HOSPITAL] at [REDACTED].
Results: Creatinine 1.0 mg/dL.
EXAM:
CT NECK WITH CONTRAST
TECHNIQUE: Multidetector CT imaging of the neck was performed using the
standard protocol following the bolus administration of intravenous
contrast.

[Series 2: neck 2.00 br40 s3 st/ no angle · axial · 0.43mm/px · z∈[-787,-667]mm · 3 of 122 slices shown, 4 images]
[im 31/122  soft-tissue]
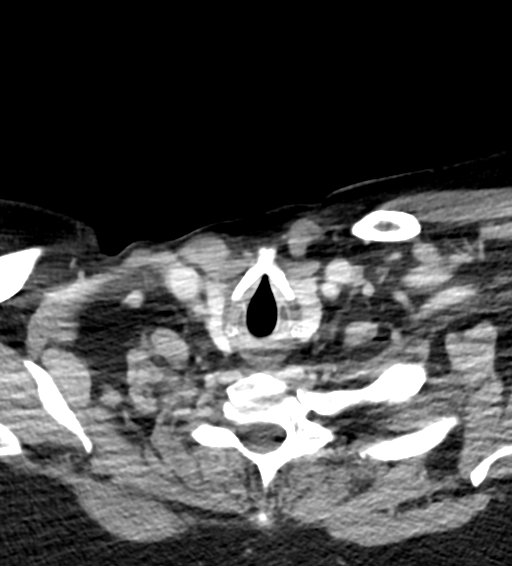
[im 31/122  bone]
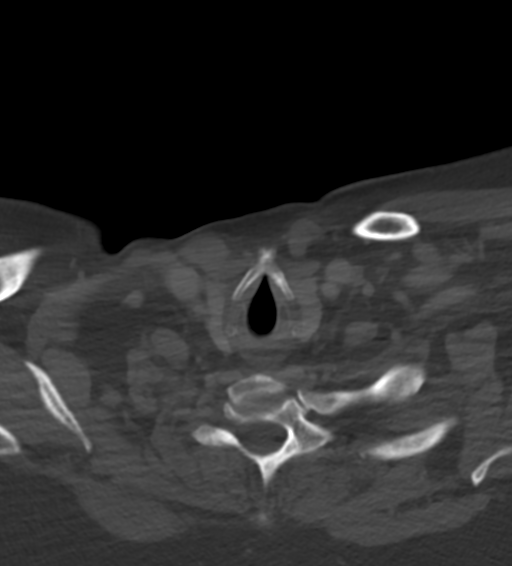
[im 61/122  bone]
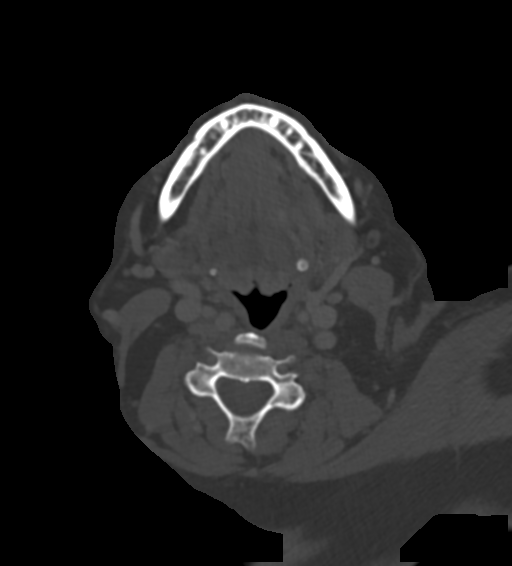
[im 91/122  bone]
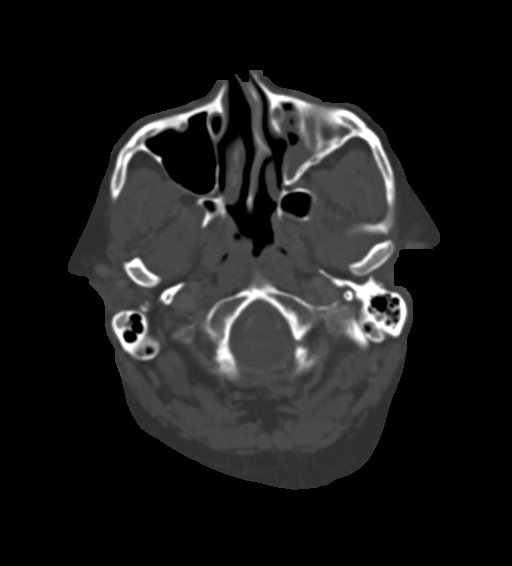

[Series 4: neck 2.00 br60 s3 bone/ no angle · axial · 0.43mm/px · z∈[-767,-687]mm · 2 of 122 slices shown]
[im 41/122  bone]
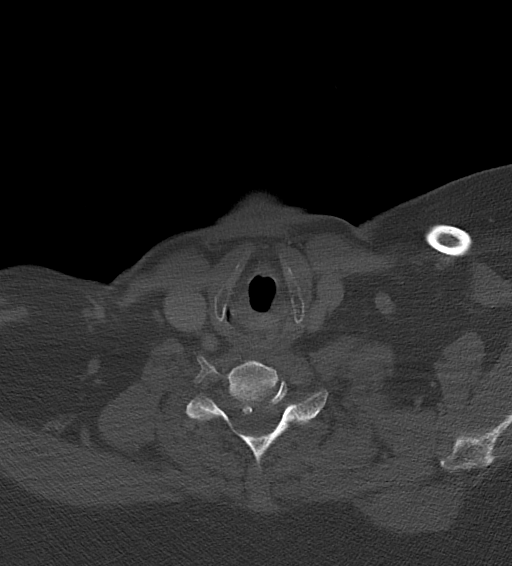
[im 81/122  bone]
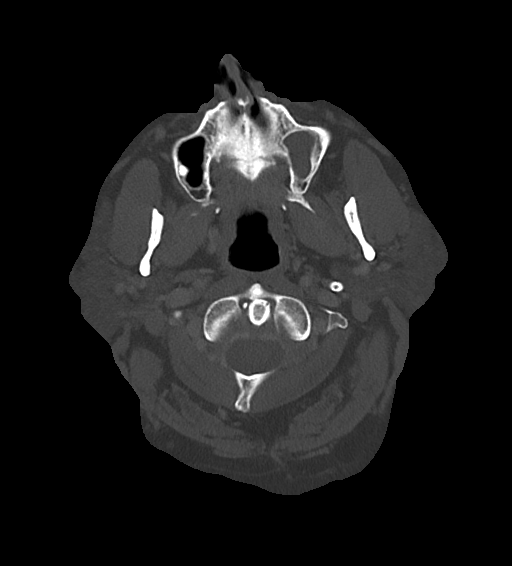

[Series 6: neck 2.00 br36 s3 angled axial (person_name) · axial · 0.43mm/px · z∈[-767,-687]mm · 2 of 122 slices shown]
[im 41/122  bone]
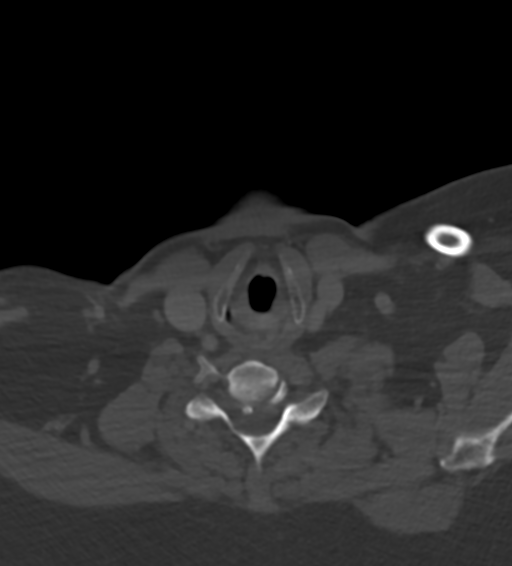
[im 81/122  bone]
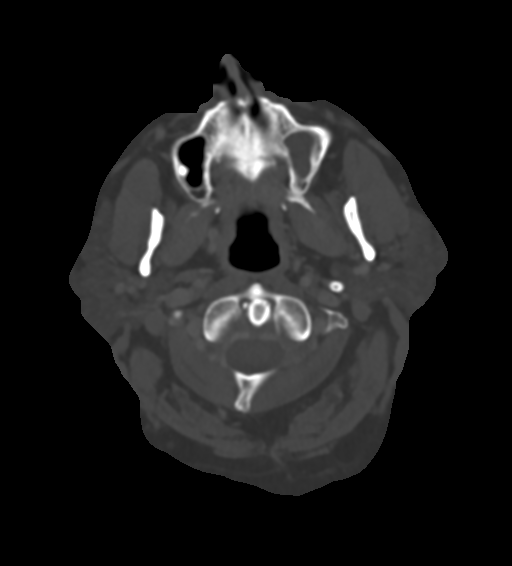

[Series 10: neck 2.00 br40 s3 (person_name) · coronal · 0.43mm/px · 3 of 121 slices shown (1 of 2)]
[im 25/121  bone]
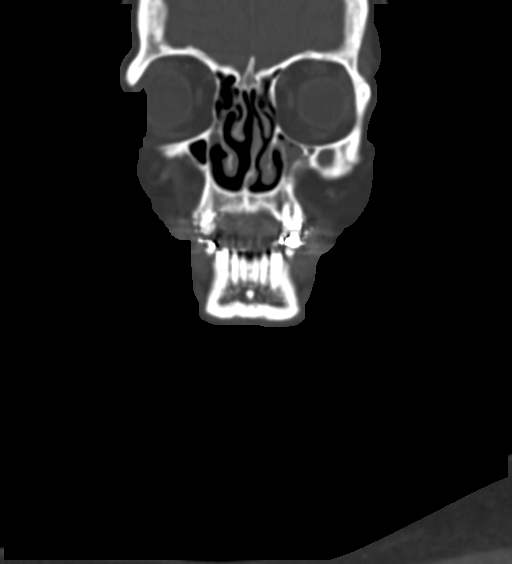
[im 49/121  bone]
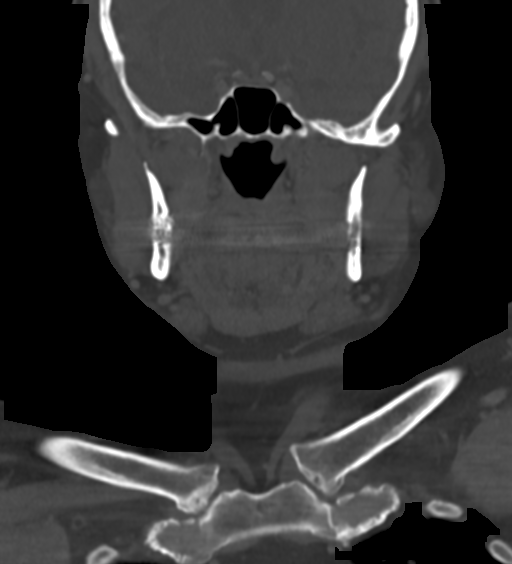
[im 73/121  bone]
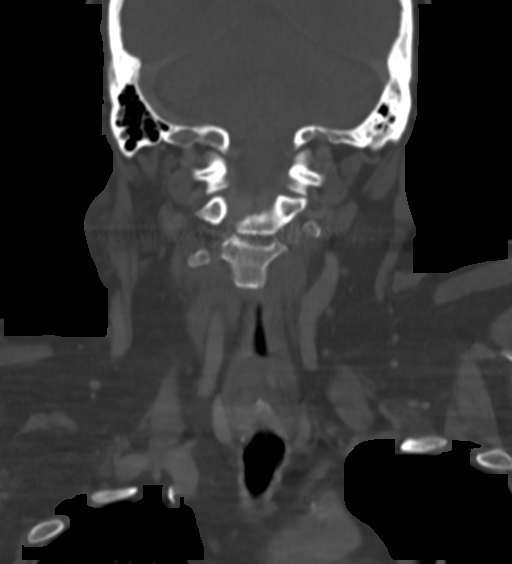

[Series 12: neck 2.00 br40 s3 (person_name) · sagittal · 0.48mm/px · 5 of 108 slices shown, 6 images (2 of 2)]
[im 36/108  bone]
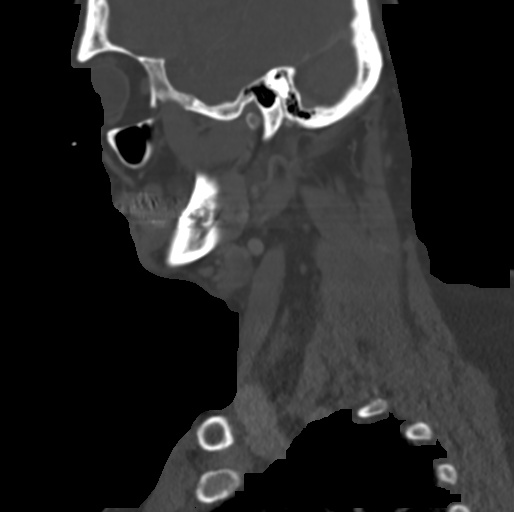
[im 45/108  bone]
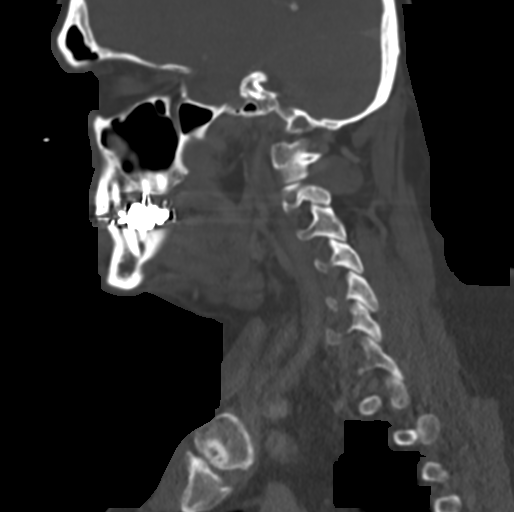
[im 54/108  soft-tissue]
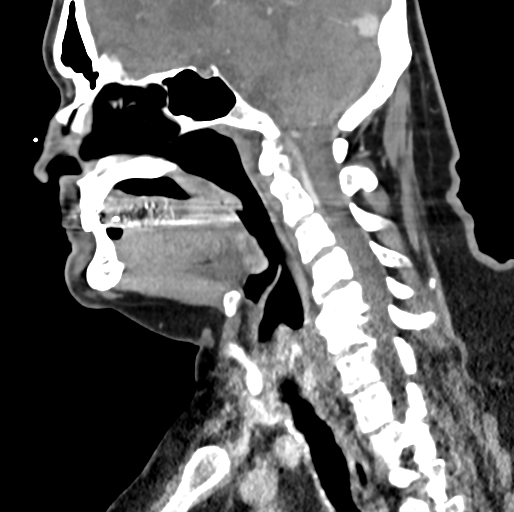
[im 54/108  bone]
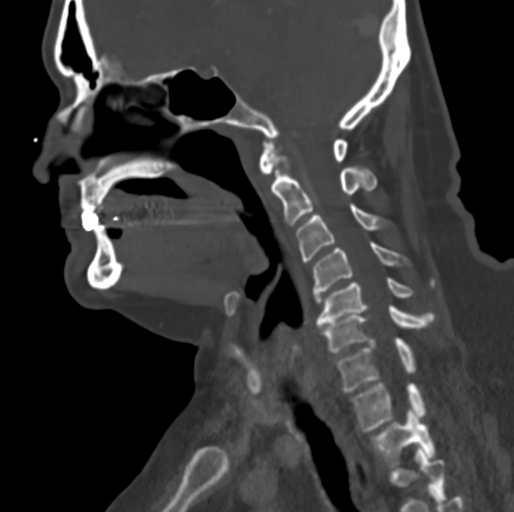
[im 63/108  bone]
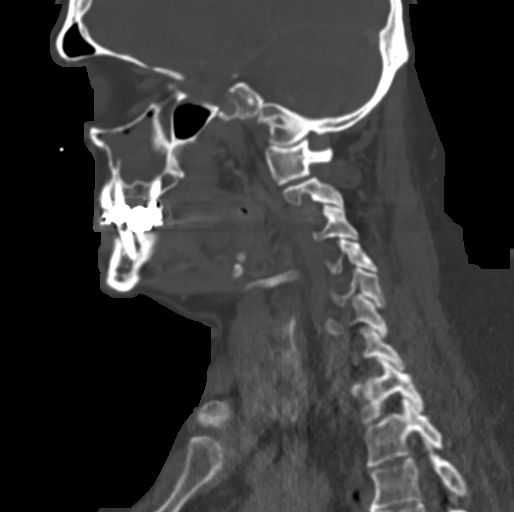
[im 72/108  bone]
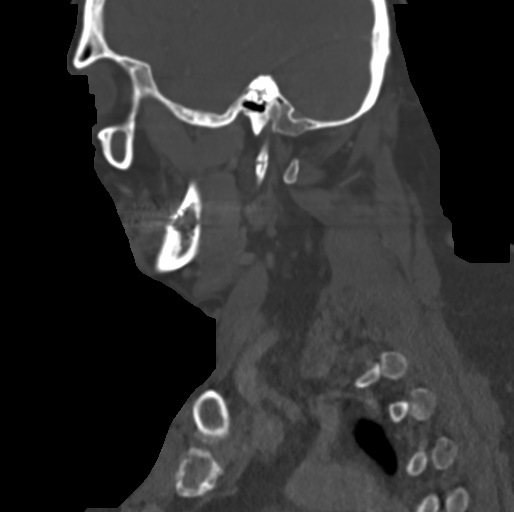

[15 of 33 positions shown; findings below may reference images not displayed]

RADIATION DOSE REDUCTION: This exam was performed according to the
departmental dose-optimization program which includes automated
exposure control, adjustment of the mA and/or kV according to
patient size and/or use of iterative reconstruction technique.

CONTRAST:  75mL 7PSYU9-CMM IOPAMIDOL (7PSYU9-CMM) INJECTION 61%
FINDINGS: Pharynx and larynx: No evidence of mucosal or submucosal lesion.
Some limitation in evaluation of the oropharynx because of streak
artifact from dental work.

Salivary glands: Parotid and submandibular glands are normal.

Thyroid: Normal

Lymph nodes: No lymphadenopathy on either side of the neck.

Vascular: Normal

Limited intracranial: Normal

Visualized orbits: Normal

Mastoids and visualized paranasal sinuses: Chronic inflammatory
change of the left maxillary sinus with some volume loss. Other
sinuses are clear.

Skeleton: Degenerative cervical spondylosis.

Upper chest: Lung apices are clear.

Other: No sign of soft tissue neck infection. Evaluation of 18 is
limited because of dense artifact from dental work. I do not see
evidence of advanced periodontal disease.

Elongated styloid process on the left with calcification of the
stylohyoid ligament. Does the patient have left-sided pain
consistent with Montecode syndrome?
IMPRESSION: Negative examination. No evidence of neck mass or lymphadenopathy.
No evidence of inflammatory mucosal or submucosal disease.

Artifact from extensive dental work. I do not see any evidence of
advanced periodontal disease affecting either the maxillary or
mandibular dentition.

Chronic inflammatory changes of the left maxillary sinus, with some
volume loss.

Calcification of the styloid process and stylohyoid ligament on the
left that could be associated with left-sided Montecode syndrome.

## 2022-11-14 ENCOUNTER — Other Ambulatory Visit: Payer: Self-pay | Admitting: Orthopaedic Surgery

## 2022-11-14 DIAGNOSIS — M5412 Radiculopathy, cervical region: Secondary | ICD-10-CM

## 2022-11-14 DIAGNOSIS — M542 Cervicalgia: Secondary | ICD-10-CM

## 2022-12-16 ENCOUNTER — Encounter: Payer: Self-pay | Admitting: Internal Medicine

## 2022-12-16 ENCOUNTER — Encounter (HOSPITAL_COMMUNITY): Payer: Self-pay

## 2022-12-16 ENCOUNTER — Other Ambulatory Visit: Payer: Self-pay | Admitting: Internal Medicine

## 2022-12-16 DIAGNOSIS — E785 Hyperlipidemia, unspecified: Secondary | ICD-10-CM | POA: Insufficient documentation

## 2022-12-16 DIAGNOSIS — N183 Chronic kidney disease, stage 3 unspecified: Secondary | ICD-10-CM | POA: Insufficient documentation

## 2022-12-16 DIAGNOSIS — E7800 Pure hypercholesterolemia, unspecified: Secondary | ICD-10-CM | POA: Insufficient documentation

## 2022-12-16 DIAGNOSIS — I10 Essential (primary) hypertension: Secondary | ICD-10-CM | POA: Insufficient documentation

## 2022-12-16 NOTE — Progress Notes (Signed)
Plan of Care Note for accepted transfer   Patient: Jaime Dean MRN: [id number]   DOA: (Not on file)  Facility requesting transfer: [facility] Requesting Provider: Virgil Benedict, MD (Hospitalist) Reason for transfer: Services not available Facility course:  67 year old male with history of OSA, diabetes, obesity, CKD 3, hyperlipidemia, hypertension, diabetes, PTSD, bipolar on Depakote, glaucoma.  Patient presented with worsening lower extremity edema for a week with developing difficulty walking and dyspnea on exertion secondary to pain.  Noted to have history of prior DVT around his left knee around 5 to 10 years prior following a surgery but he is unsure when exactly.  Imaging showed extensive occlusive right lower extremity DVT and partially occlusive left femoral vein DVT.  Patient started on heparin infusion and admitted overnight.  Today noted to have worsening cyanosis and pulses now only dopplerable on left.  Now with decree sensation left lower extremity as well.  Concerned for developing ischemia.  Case was discussed with vascular surgery here who agreed to evaluate and likely intervene on patient after transfer with hospitalist to admit  Transferring provider states patient has remained hemodynamically stable and labs significant primarily for leukocytosis to 12.7.  Plan of care: The patient is accepted for admission to Telemetry unit, at [facility]..   Author: Synetta Fail, MD 12/16/2022  Check [email or web address] for on-call coverage.  Nursing staff, Please call [facility] Admits & Consults System-Wide number on Amion as soon as patient's arrival, so appropriate admitting provider can evaluate the pt.

## 2022-12-17 ENCOUNTER — Other Ambulatory Visit (HOSPITAL_COMMUNITY): Payer: Self-pay

## 2022-12-17 ENCOUNTER — Other Ambulatory Visit: Payer: Self-pay

## 2022-12-17 ENCOUNTER — Inpatient Hospital Stay (HOSPITAL_COMMUNITY): Payer: Medicare Other

## 2022-12-17 ENCOUNTER — Other Ambulatory Visit: Payer: Medicare Other

## 2022-12-17 ENCOUNTER — Inpatient Hospital Stay (HOSPITAL_COMMUNITY)
Admission: RE | Admit: 2022-12-17 | Discharge: 2022-12-19 | DRG: 271 | Disposition: A | Discharge status: Home / Self Care | Payer: Medicare Other | Source: Other Acute Inpatient Hospital | Attending: Internal Medicine | Admitting: Internal Medicine

## 2022-12-17 ENCOUNTER — Encounter (HOSPITAL_COMMUNITY): Payer: Self-pay | Admitting: Family Medicine

## 2022-12-17 DIAGNOSIS — E871 Hypo-osmolality and hyponatremia: Secondary | ICD-10-CM

## 2022-12-17 DIAGNOSIS — Z7984 Long term (current) use of oral hypoglycemic drugs: Secondary | ICD-10-CM | POA: Diagnosis not present

## 2022-12-17 DIAGNOSIS — Z86718 Personal history of other venous thrombosis and embolism: Secondary | ICD-10-CM

## 2022-12-17 DIAGNOSIS — I82423 Acute embolism and thrombosis of iliac vein, bilateral: Secondary | ICD-10-CM | POA: Diagnosis present

## 2022-12-17 DIAGNOSIS — Z79899 Other long term (current) drug therapy: Secondary | ICD-10-CM

## 2022-12-17 DIAGNOSIS — I82431 Acute embolism and thrombosis of right popliteal vein: Secondary | ICD-10-CM | POA: Diagnosis present

## 2022-12-17 DIAGNOSIS — Z881 Allergy status to other antibiotic agents status: Secondary | ICD-10-CM

## 2022-12-17 DIAGNOSIS — Z880 Allergy status to penicillin: Secondary | ICD-10-CM

## 2022-12-17 DIAGNOSIS — J449 Chronic obstructive pulmonary disease, unspecified: Secondary | ICD-10-CM | POA: Diagnosis present

## 2022-12-17 DIAGNOSIS — H9190 Unspecified hearing loss, unspecified ear: Secondary | ICD-10-CM | POA: Diagnosis present

## 2022-12-17 DIAGNOSIS — R52 Pain, unspecified: Secondary | ICD-10-CM

## 2022-12-17 DIAGNOSIS — Z888 Allergy status to other drugs, medicaments and biological substances status: Secondary | ICD-10-CM | POA: Diagnosis not present

## 2022-12-17 DIAGNOSIS — Z882 Allergy status to sulfonamides status: Secondary | ICD-10-CM | POA: Diagnosis not present

## 2022-12-17 DIAGNOSIS — G8929 Other chronic pain: Secondary | ICD-10-CM | POA: Diagnosis present

## 2022-12-17 DIAGNOSIS — Z9884 Bariatric surgery status: Secondary | ICD-10-CM | POA: Diagnosis not present

## 2022-12-17 DIAGNOSIS — I82413 Acute embolism and thrombosis of femoral vein, bilateral: Secondary | ICD-10-CM | POA: Diagnosis present

## 2022-12-17 DIAGNOSIS — Z6841 Body Mass Index (BMI) 40.0 and over, adult: Secondary | ICD-10-CM | POA: Diagnosis not present

## 2022-12-17 DIAGNOSIS — I82403 Acute embolism and thrombosis of unspecified deep veins of lower extremity, bilateral: Secondary | ICD-10-CM

## 2022-12-17 DIAGNOSIS — F17210 Nicotine dependence, cigarettes, uncomplicated: Secondary | ICD-10-CM | POA: Diagnosis present

## 2022-12-17 DIAGNOSIS — L03116 Cellulitis of left lower limb: Secondary | ICD-10-CM

## 2022-12-17 DIAGNOSIS — I82409 Acute embolism and thrombosis of unspecified deep veins of unspecified lower extremity: Principal | ICD-10-CM | POA: Diagnosis present

## 2022-12-17 DIAGNOSIS — I82491 Acute embolism and thrombosis of other specified deep vein of right lower extremity: Secondary | ICD-10-CM | POA: Diagnosis not present

## 2022-12-17 DIAGNOSIS — M7989 Other specified soft tissue disorders: Secondary | ICD-10-CM

## 2022-12-17 DIAGNOSIS — F419 Anxiety disorder, unspecified: Secondary | ICD-10-CM | POA: Diagnosis present

## 2022-12-17 DIAGNOSIS — E785 Hyperlipidemia, unspecified: Secondary | ICD-10-CM | POA: Diagnosis present

## 2022-12-17 DIAGNOSIS — I82220 Acute embolism and thrombosis of inferior vena cava: Secondary | ICD-10-CM | POA: Diagnosis present

## 2022-12-17 DIAGNOSIS — G4733 Obstructive sleep apnea (adult) (pediatric): Secondary | ICD-10-CM | POA: Diagnosis present

## 2022-12-17 DIAGNOSIS — F319 Bipolar disorder, unspecified: Secondary | ICD-10-CM | POA: Diagnosis present

## 2022-12-17 DIAGNOSIS — F4310 Post-traumatic stress disorder, unspecified: Secondary | ICD-10-CM | POA: Diagnosis present

## 2022-12-17 DIAGNOSIS — E119 Type 2 diabetes mellitus without complications: Secondary | ICD-10-CM

## 2022-12-17 LAB — GLUCOSE, CAPILLARY
Glucose-Capillary: 134 mg/dL — ABNORMAL HIGH (ref 70–99)
Glucose-Capillary: 136 mg/dL — ABNORMAL HIGH (ref 70–99)

## 2022-12-17 LAB — HEPARIN LEVEL (UNFRACTIONATED)
Heparin Unfractionated: 0.28 [IU]/mL — ABNORMAL LOW (ref 0.30–0.70)
Heparin Unfractionated: 0.39 [IU]/mL (ref 0.30–0.70)

## 2022-12-17 MED ORDER — DIVALPROEX SODIUM ER 500 MG PO TB24
500.0000 mg | ORAL_TABLET | Freq: Three times a day (TID) | ORAL | Status: DC
  Administered 2022-12-17 – 2022-12-19 (×6): 500 mg via ORAL
  Filled 2022-12-17 (×7): qty 1

## 2022-12-17 MED ORDER — ACETAMINOPHEN 325 MG PO TABS: 650.0000 mg | ORAL_TABLET | Freq: Four times a day (QID) | ORAL | Status: DC | PRN

## 2022-12-17 MED ORDER — PREGABALIN 100 MG PO CAPS
200.0000 mg | ORAL_CAPSULE | Freq: Every day | ORAL | Status: DC
  Administered 2022-12-17 – 2022-12-19 (×3): 200 mg via ORAL
  Filled 2022-12-17 (×3): qty 2

## 2022-12-17 MED ORDER — FLUOXETINE HCL 20 MG PO CAPS
40.0000 mg | ORAL_CAPSULE | Freq: Every day | ORAL | Status: DC
  Administered 2022-12-18 – 2022-12-19 (×2): 40 mg via ORAL
  Filled 2022-12-17 (×2): qty 2

## 2022-12-17 MED ORDER — INSULIN ASPART 100 UNIT/ML IJ SOLN
0.0000 [IU] | Freq: Three times a day (TID) | INTRAMUSCULAR | Status: DC
  Administered 2022-12-17 – 2022-12-19 (×3): 3 [IU] via SUBCUTANEOUS

## 2022-12-17 MED ORDER — MORPHINE SULFATE (PF) 2 MG/ML IV SOLN
2.0000 mg | INTRAVENOUS | Status: DC | PRN
  Administered 2022-12-17 – 2022-12-18 (×4): 2 mg via INTRAVENOUS
  Filled 2022-12-17 (×4): qty 1

## 2022-12-17 MED ORDER — HEPARIN (PORCINE) 25000 UT/250ML-% IV SOLN
1750.0000 [IU]/h | INTRAVENOUS | Status: DC
  Administered 2022-12-17 – 2022-12-18 (×3): 1750 [IU]/h via INTRAVENOUS
  Filled 2022-12-17 (×3): qty 250

## 2022-12-17 MED ORDER — LINAGLIPTIN 5 MG PO TABS
5.0000 mg | ORAL_TABLET | Freq: Every day | ORAL | Status: DC
  Administered 2022-12-17 – 2022-12-19 (×3): 5 mg via ORAL
  Filled 2022-12-17 (×3): qty 1

## 2022-12-17 MED ORDER — ONDANSETRON HCL 4 MG PO TABS: 4.0000 mg | ORAL_TABLET | Freq: Four times a day (QID) | ORAL | Status: DC | PRN

## 2022-12-17 MED ORDER — ONDANSETRON HCL 4 MG/2ML IJ SOLN: 4.0000 mg | Freq: Four times a day (QID) | INTRAMUSCULAR | Status: DC | PRN

## 2022-12-17 MED ORDER — ROSUVASTATIN CALCIUM 20 MG PO TABS
20.0000 mg | ORAL_TABLET | Freq: Every day | ORAL | Status: DC
  Administered 2022-12-17 – 2022-12-19 (×3): 20 mg via ORAL
  Filled 2022-12-17 (×3): qty 1

## 2022-12-17 MED ORDER — SODIUM CHLORIDE 0.9 % IV SOLN
INTRAVENOUS | Status: DC
  Administered 2022-12-18: 500 mL via INTRAVENOUS

## 2022-12-17 MED ORDER — CEPHALEXIN 500 MG PO CAPS
500.0000 mg | ORAL_CAPSULE | Freq: Three times a day (TID) | ORAL | Status: DC
  Administered 2022-12-17 – 2022-12-19 (×7): 500 mg via ORAL
  Filled 2022-12-17 (×7): qty 1

## 2022-12-17 MED ORDER — HEPARIN BOLUS VIA INFUSION
1000.0000 [IU] | Freq: Once | INTRAVENOUS | Status: AC
  Administered 2022-12-17: 1000 [IU] via INTRAVENOUS
  Filled 2022-12-17: qty 1000

## 2022-12-17 MED ORDER — OXYCODONE HCL 5 MG PO TABS
5.0000 mg | ORAL_TABLET | ORAL | Status: DC | PRN
  Administered 2022-12-17 – 2022-12-19 (×4): 5 mg via ORAL
  Filled 2022-12-17 (×4): qty 1

## 2022-12-17 MED ORDER — BUSPIRONE HCL 5 MG PO TABS: 10.0000 mg | ORAL_TABLET | Freq: Three times a day (TID) | ORAL | Status: DC | PRN

## 2022-12-17 NOTE — Progress Notes (Signed)
Right lower extremity venous duplex has been completed. Preliminary results can be found in CV Proc through chart review.  Results were given to Dr. Edilia Bo.  12/17/22 5:14 PM Olen Cordial RVT

## 2022-12-17 NOTE — Progress Notes (Signed)
Pt placed on CPAP to rest for the evening, pt tolerating well at this time   12/17/22 2238  BiPAP/CPAP/SIPAP  $ Non-Invasive Home Ventilator  Initial  $ Face Mask Medium Yes  BiPAP/CPAP/SIPAP Pt Type Adult  BiPAP/CPAP/SIPAP Resmed  Mask Type Full face mask  Mask Size Medium  Respiratory Rate 18 breaths/min  EPAP  (CPAP 5- home regimen)  FiO2 (%) 36 %  Flow Rate 4 lpm (home regimen)  Patient Home Equipment No  Auto Titrate No  CPAP/SIPAP surface wiped down Yes  BiPAP/CPAP /SiPAP Vitals  Pulse Rate 89  Resp 18  SpO2 95 %  MEWS Score/Color  MEWS Score 0  MEWS Score Color [place] Si

## 2022-12-17 NOTE — Assessment & Plan Note (Signed)
Pain, tenderness, erythema and edema on LLL Keflex TID (will ask pharmacy to see if cephalosporins okay. Tolerates augmentin)

## 2022-12-17 NOTE — H&P (Signed)
History and Physical    Patient: Jaime Dean DGU:[id number] DOB: Oct 14, 1955 DOA: 12/17/2022 DOS: the patient was seen and examined on 12/17/2022 PCP: Olive Bass, MD  Patient coming from: Outside Hospital - lives with his wife. Uses cane at times.    Chief Complaint: right leg swelling and pain.   HPI: Jaime Dean is a 67 y.o. male with medical history significant of T2DM, COPD, depression/anxiety, arthritis, OSA on cpap  who presented to ED with complaints of bilateral LE swelling. Over the weekend he noticed his legs became acutely swollen, right worse than left. He had pain as well that prompted him to go to ED. He denies travel, he does not smoke, does not use testosterone, no recent covid vaccines and denies any malignancy. He did get a steroid injection in his back about one week prior to onset of leg swelling.   He was admitted to [facility] for heparin gtt on 12/15/22 and discharged 12/17/22 to [facility] due to physical exam findings with decreased sensation to the right LE, leg red and cyanosis of feet which had worsened overnight. Dr. Randie Heinz was called who accepted patient to [facility].    Denies any fever/chills, vision changes +headaches, chest pain or palpitations, shortness of breath or cough, abdominal pain, N/V/D. He does endorse dysuria.   Hx of DVT in 2009 after knee surgery. No family history of blood clots.   ER Course:  vitals at [place]: afebrile, bp: 96/56, HR: 97, RR: 22, oxygen: 97%RA. Blood pressure improved to 134/80 with 1L IVF.  Per chart records from [facility]: INR: 1.0, sodium: 131, BUN: 22, creatinine 1.0, glucose 315, troponin wnl, valproic acid: 10.2.  CXR no active disease DVT studies: right extensive occlusive DVT through the femoropopliteal system and calf veins with no significant antegrade flow on color doppler. Partially occlusive DVT in the distal left femoral vein.  CTA chest: No PE, no acute findings    Review of Systems: As mentioned in the history of  present illness. All other systems reviewed and are negative. Past Medical History:  Diagnosis Date   Bipolar 1 disorder (HCC)    Diabetes mellitus type II    Glaucoma    Hearing loss    History of blood clots    Manic depression (HCC)    Plantar fasciitis    Pneumonia    Post traumatic stress disorder (PTSD)    Sleep apnea    Wears glasses    Past Surgical History:  Procedure Laterality Date   COLLATERAL LIGAMENT REPAIR, KNEE     KNEE ARTHROSCOPY     right knee   Social History:  reports that he quit smoking about 19 years ago. He has never used smokeless tobacco. He reports that he does not drink alcohol and does not use drugs.  Allergies  Allergen Reactions   Penicillins Anaphylaxis    Patient took augmentin as an outpatient    Sulfa Antibiotics Swelling   Tegaderm Ag Mesh [Silver] Other (See Comments)    Blisters, peeling skin    Avelox [Moxifloxacin Hcl In Nacl] Rash   Clindamycin Rash    No family history on file.  Prior to Admission medications   Medication Sig Start Date End Date Taking? Authorizing Provider  FLUoxetine (PROZAC) 20 MG tablet Take 20 mg by mouth daily.      [provider]  glipiZIDE (GLUCOTROL XL) 10 MG 24 hr tablet Take 10 mg by mouth.    [provider]  Iloperidone (FANAPT)  6 MG TABS Take by mouth at bedtime.      [provider]  metFORMIN (GLUMETZA) 1000 MG (MOD) 24 hr tablet Take 1,000 mg by mouth daily with breakfast.      [provider]  multivitamin-iron-minerals-folic acid (CENTRUM) chewable tablet Chew 1 tablet by mouth daily.      [provider]  rosuvastatin (CRESTOR) 20 MG tablet Take 20 mg by mouth Nightly.      [provider]  Valproic Acid (STAVZOR) 500 MG CPDR Take by mouth.      [provider]    Physical Exam: Vitals:   12/17/22 1024 12/17/22 1035 12/17/22 1300  BP: 128/69 112/89   Pulse: 81 85   Resp:  16   Temp: 97.9 F (36.6 C)    TempSrc: Oral     SpO2: 94%    Weight:   129.9 kg   General:  Appears calm and comfortable and is in NAD. Obese  Eyes:  PERRL, EOMI, normal lids, iris ENT:  HOH, lips & tongue, mmm; poor dentition Neck:  no LAD, masses or thyromegaly; no carotid bruits Cardiovascular:  RRR, no m/r/g. BLE edema R>L Respiratory:   CTA bilaterally with no wheezes/rales/rhonchi.  Normal respiratory effort. Abdomen:  soft, NT, ND, NABS. Hard foreign object in ventral stomach  Back:   normal alignment, no CVAT Skin:  no rash or induration seen on limited exam Musculoskeletal:  grossly normal tone BUE. Can not move his RLE due to pain/swelling.  Lower extremity:   Limited foot exam with no ulcerations.  Thready pedal pulse on right foot, but palpable. DP 1+, pedal pulses intact on left foot.  Psychiatric:  grossly normal mood and affect, speech fluent and appropriate, AOx3 Neurologic:  CN 2-12 grossly intact, moves all extremities in coordinated fashion, sensation intact   Radiological Exams on Admission: Independently reviewed - see discussion in A/P where applicable  No results found.  EKG: Independently reviewed.  NSR with rate 90; nonspecific ST changes with no evidence of acute ischemia   Labs on Admission: I have personally reviewed the available labs and imaging studies at the time of the admission.  Pertinent labs on 9/17 Hgb: 14.1 Sodium: 132 Creatinine: .90/BUN: 17    Assessment and Plan: Principal Problem:   Acute DVT (deep venous thrombosis) (HCC) Active Problems:   Cellulitis of left lower leg   Hyponatremia   Type 2 diabetes mellitus without complication, without long-term current use of insulin (HCC)   Bipolar I disorder with depression/PTSD   HLD (hyperlipidemia)   Chronic back pain   Obstructive sleep apnea    Assessment and Plan: * Acute DVT (deep venous thrombosis) (HCC) 67 year old male presenting from [facility] hospital with worsening pain, decreased sensation and color change to his  right LE in setting of extensive occlusive DVT through the femoropopliteal system and calf veins with no significant antegrade flow on color doppler. Also has partially occlusive DVT to his left distal femoral vein.  -admit to telemetry  -CTA negative for PE performed at outside hospital, reviewed  -vascular surgery consulted: Dr. Edilia Bo  -continue heparin gtt -exam encouraging today. Pedal pulses palpable and foot warm to touch with no cyanosis.  -pain control  -no obvious provoking events. Would do hypercoag work up   Cellulitis of left lower leg Pain, tenderness, erythema and edema on LLL Keflex TID (will ask pharmacy to see if cephalosporins okay. Tolerates augmentin)   Hyponatremia Sodium stable at 132 per hospital records.  ?  Med related Continue to follow   Type 2 diabetes mellitus without complication, without long-term current use of insulin (HCC) A1C 7.4 at [facility] SSI and accuchecks QAC/HS   Bipolar I disorder with depression/PTSD Continue Divalproex, prozac and buspar   HLD (hyperlipidemia) Continue crestor daily   Chronic back pain On lyrica with injections PRN   Obstructive sleep apnea Continue cpap at night     Advance Care Planning:   Code Status: Full Code   Consults: vascular surgery: Dr. Edilia Bo   DVT Prophylaxis: heparin gtt   Family Communication: wife at bedside   Severity of Illness: The appropriate patient status for this patient is INPATIENT. Inpatient status is judged to be reasonable and necessary in order to provide the required intensity of service to ensure the patient's safety. The patient's presenting symptoms, physical exam findings, and initial radiographic and laboratory data in the context of their chronic comorbidities is felt to place them at high risk for further clinical deterioration. Furthermore, it is not anticipated that the patient will be medically stable for discharge from the hospital within 2  midnights of admission.   * I certify that at the point of admission it is my clinical judgment that the patient will require inpatient hospital care spanning beyond 2 midnights from the point of admission due to high intensity of service, high risk for further deterioration and high frequency of surveillance required.*  Author: Orland Mustard, MD 12/17/2022 4:04 PM  For on call review [email or web address].

## 2022-12-17 NOTE — Consult Note (Addendum)
Hospital Consult  VASCULAR SURGERY ASSESSMENT & PLAN:   EXTENSIVE DVT RIGHT LOWER EXTREMITY: This patient has an extensive right lower extremity DVT which extends into the external iliac vein.  Based on his duplex this did not extend into the common iliac vein or IVC.  Given the significant swelling in the right lower extremity I think he would be a good candidate for mechanical thrombectomy to lower his risk of long-term postphlebitic syndrome.  This appears to be an unprovoked DVT.  He did have aninjection in his lumbar spine in 2 weeks ago but has not had any recent surgery.  He has some partially occlusive clot in the left femoral vein but no clot in the common femoral vein.  His renal function is normal.  We have discussed the indications for the procedure and the potential complications including, but not limited to, pulmonary embolus, renal insufficiency, or bleeding.  We have also discussed the option of anticoagulation alone.  He would like to proceed with mechanical thrombectomy.  All of his questions were answered and he is agreeable to proceed.  Cari Caraway, MD 5:15 PM   Reason for Consult:  BLE DVT Requesting Physician:  Artis Flock MRN #:  [id number]  History of Present Illness: This is a 67 y.o. male who was transferred from [facility] for further evaluation for lower extremity DVT.    The pt is accompanied by his wife Johnny Bridge of 46 years and she is a retired Public house manager.   He states he started having some swelling in his legs on Saturday and it progressively got worse.  He went to the hospital and was told he had an extensive DVT in the right leg and a DVT in the left leg but not as extensive.    He states that he has hx of chronic back issues and had back surgery about 2 weeks ago.  He states that he does not have any cramping in his legs when he walks or rest pain.  He denies any tissue loss.  When he walks long distances, he does need a cane due to his chronic back issues.  He has  remote hx of smoking but has been quit many years.  He has hx of DVT in 2009 after having a laparoscopy surgery.   He has hx of lap band surgery and abdominal mesh for hernia.  He denies any hx of kidney issues, heart attack, or stroke.  He does have DM.    His creatinine on 11/19/2022 was 0.95.  The pt is on a statin for cholesterol management.  The pt is not on a daily aspirin.   Other AC:  heparin gtt  The pt is not on medication for hypertension.   The pt is  on medication for diabetes PTA. Tobacco hx:  former  Past Medical History:  Diagnosis Date   Bipolar 1 disorder (HCC)    Diabetes mellitus type II    Glaucoma    Hearing loss    History of blood clots    Manic depression (HCC)    Plantar fasciitis    Pneumonia    Post traumatic stress disorder (PTSD)    Sleep apnea    Wears glasses     Past Surgical History:  Procedure Laterality Date   COLLATERAL LIGAMENT REPAIR, KNEE     KNEE ARTHROSCOPY     right knee    Allergies  Allergen Reactions   Penicillins Anaphylaxis   Sulfa Antibiotics Swelling   Tegaderm Ag Mesh [Silver]  Other (See Comments)    Blisters, peeling skin    Avelox [Moxifloxacin Hcl In Nacl] Rash   Clindamycin Rash    Prior to Admission medications   Medication Sig Start Date End Date Taking? Authorizing Provider  albuterol (VENTOLIN HFA) 108 (90 Base) MCG/ACT inhaler Inhale 3 puffs into the lungs as needed for wheezing or shortness of breath. 03/21/21  Yes [provider]  busPIRone (BUSPAR) 10 MG tablet Take 10-40 mg by mouth as needed (anxiety). 03/02/15  Yes [provider]  divalproex (DEPAKOTE ER) 500 MG 24 hr tablet Take 500 mg by mouth in the morning, at noon, and at bedtime. 12/01/19  Yes [provider]  fexofenadine (ALLEGRA) 180 MG tablet Take 180 mg by mouth daily. 08/05/19  Yes [provider]  FLUoxetine (PROZAC) 40 MG capsule Take 40 mg by mouth daily.   Yes [provider]  JANUVIA 100 MG  tablet Take 100 mg by mouth daily. 11/19/22  Yes [provider]  multivitamin-iron-minerals-folic acid (CENTRUM) chewable tablet Chew 1 tablet by mouth daily.     Yes [provider]  pregabalin (LYRICA) 200 MG capsule Take 200 mg by mouth daily.   Yes [provider]  rosuvastatin (CRESTOR) 20 MG tablet Take 20 mg by mouth daily.   Yes [provider]  amoxicillin-clavulanate (AUGMENTIN) 875-125 MG tablet Take 1 tablet by mouth 2 (two) times daily. Patient not taking: Reported on 12/17/2022 12/11/22   [provider]  metFORMIN (GLUMETZA) 1000 MG (MOD) 24 hr tablet Take 1,000 mg by mouth daily with breakfast.   Patient not taking: Reported on 12/17/2022    [provider]    Social History   Socioeconomic History   Marital status: Married    Spouse name: Not on file   Number of children: Not on file   Years of education: Not on file   Highest education level: Not on file  Occupational History   Not on file  Tobacco Use   Smoking status: Former    Current packs/day: 0.00    Types: Cigarettes    Quit date: 2005    Years since quitting: 19.7   Smokeless tobacco: Never   Tobacco comments:    light smoker/ off and on  Substance and Sexual Activity   Alcohol use: No   Drug use: No   Sexual activity: Not on file  Other Topics Concern   Not on file  Social History Narrative   Not on file   Social Determinants of Health   Financial Resource Strain: Not on file  Food Insecurity: Low Risk  (11/12/2022)   Received from [facility]   Hunger Vital Sign    Within the past 12 months, you worried that your food would run out before you got money to buy more: Not on file    Ran Out of Food in the Last Year: Never true  Transportation Needs: Not on file (11/12/2022)  Physical Activity: Not on file  Stress: Not on file  Social Connections: Not on file  Intimate Partner Violence: Not on file    Family Hx:  negative for AAA  ROS:  [x]  Positive   [ ]  Negative   [ ]  All sytems reviewed and are negative  Cardiac: []  chest pain/pressure []  hx MI []  SOB   Vascular: []  pain in legs while walking []  pain in legs at rest []  pain in legs at night []  non-healing ulcers [x]  hx of DVT [x]  swelling in  legs  Pulmonary: []  asthma/wheezing []  home O2  Neurologic: []  hx of CVA []  mini stroke   Hematologic: []  hx of cancer  Endocrine:   [x]  diabetes []  thyroid disease  GI []  GERD  GU: []  CKD/renal failure []  HD--[]  M/W/F or []  T/T/S  Psychiatric: []  anxiety []  depression  Musculoskeletal: []  arthritis []  joint pain  Integumentary: []  rashes []  ulcers  Constitutional: []  fever  []  chills  Physical Examination  Vitals:   12/17/22 1024 12/17/22 1035  BP: 128/69 112/89  Pulse: 81 85  Resp:  16  Temp: 97.9 F (36.6 C)   SpO2: 94%    There is no height or weight on file to calculate BMI.  General:  WDWN in NAD Gait: Not observed HENT: WNL, normocephalic Pulmonary: normal non-labored breathing Cardiac: regular, without carotid bruits Abdomen: obese, soft, NT; aortic pulse is not palpable Skin: without rashes Vascular Exam/Pulses:  Right Left  Radial 2+ (normal) 2+ (normal)  DP Palpable with multiphasic doppler signal Palpable with multiphasic doppler flow  PT Multiphasic doppler flow Multiphasic doppler flow   Extremities: significant swelling RLE from thigh to ankle;  swelling in LLE is not as prominent.  Hemosiderin staining appearing skin LLE Musculoskeletal: no muscle wasting or atrophy  Neurologic: A&O X 3 Psychiatric:  The pt has Normal affect.   CBC    Component Value Date/Time   WBC 12.2 (H) 03/21/2010 0454   RBC 5.33 03/21/2010 0454   HGB 14.9 03/21/2010 0454   HCT 47.4 03/21/2010 0454   PLT 172 03/21/2010 0454   MCV 88.9 03/21/2010 0454   MCH 28.0 03/21/2010 0454   MCHC 31.4 03/21/2010 0454   RDW 15.9 (H) 03/21/2010 0454   LYMPHSABS 2.6 03/21/2010 0454   MONOABS  2.1 (H) 03/21/2010 0454   EOSABS 0.3 03/21/2010 0454   BASOSABS 0.0 03/21/2010 0454    BMET    Component Value Date/Time   NA 137 03/20/2010 0720   K 3.8 03/20/2010 0720   CL 101 03/20/2010 0720   CO2 29 03/20/2010 0720   GLUCOSE 275 (H) 03/20/2010 0720   BUN 12 03/20/2010 0720   CREATININE 0.86 03/20/2010 0720   CALCIUM 9.2 03/20/2010 0720   GFRNONAA >60 03/20/2010 0720   GFRAA  03/20/2010 0720    >60        The eGFR has been calculated using the MDRD equation. This calculation has not been validated in all clinical situations. eGFR's persistently <60 mL/min signify possible Chronic Kidney Disease.    COAGS: No results found for: "INR", "PROTIME"   Non-Invasive Vascular Imaging:   BLE venous duplex report from 12/15/2022 at [facility]:  Extensive occlusive DVT throughout the RLE   Partially occlusive DVT in the distal left femoral vein  CTA chest 12/15/2022:   No evidence of PE    ASSESSMENT/PLAN: This is a 67 y.o. male with BLE DVT with right leg occlusive DVT in setting of recent back surgery a couple of weeks ago and hx of lower extremity DVT in 2009 after surgery.   -pt with significant pain and swelling in RLE and currently on heparin gtt.  He had recent back surgery about 2 weeks ago.  He had normal renal function on 11/19/2022 and most recent labs from [place] [facility] reveal creatinine of 0.80.   -recommend compression and elevation as well as continuing heparin gtt.  Possibility of thrombolysis, but given he had surgery a couple of weeks ago, this may be a contraindication.   -this is recurrent DVT  so he most likely will need AC indefinitely and possible evaluation by hematology in the outpatient setting.   -Dr. Edilia Bo to evaluate pt and determine further plan -ok for diet today.    Doreatha Massed, PA-C [facility] [phone]

## 2022-12-17 NOTE — TOC Benefit Eligibility Note (Signed)
Patient Product/process development scientist completed.    The patient is insured through [employer].     Ran test claim for Eliquis 5 mg and the current 30 day co-pay is $43.00.  Ran test claim for Xarelto 20 mg and the current 30 day co-pay is $43.00.  This test claim was processed through [facility]- copay amounts may vary at other pharmacies due to [employer], or as the patient moves through the different stages of their insurance plan.     Roland Earl, CPHT Pharmacy Technician III Certified Patient Advocate [facility] Patient Advocate Team Direct Number: [phone]  Fax: [phone]

## 2022-12-17 NOTE — Assessment & Plan Note (Signed)
On lyrica with injections PRN

## 2022-12-17 NOTE — Progress Notes (Signed)
Arrived to [facility] with [facility] transport.  Vitals signs obtained and placed on cardiac monitor.     12/17/22 1024  Vitals  Temp 97.9 F (36.6 C)  Temp Source Oral  BP 128/69  MAP (mmHg) 85  BP Location Left Arm  BP Method Automatic  Patient Position (if appropriate) Lying  Pulse Rate 81  Pulse Rate Source Monitor  MEWS COLOR  MEWS Score Color Green  Oxygen Therapy  SpO2 94 %  O2 Device Room Air  MEWS Score  MEWS Temp 0  MEWS Systolic 0  MEWS Pulse 0  MEWS RR 0  MEWS LOC 0  MEWS Score 0

## 2022-12-17 NOTE — Assessment & Plan Note (Signed)
Sodium stable at 132 per hospital records.  ? Med related Continue to follow

## 2022-12-17 NOTE — Progress Notes (Addendum)
Pharmacy Consult for heparin Indication: DVT  Allergies  Allergen Reactions   Penicillins Anaphylaxis   Sulfa Antibiotics Swelling   Tegaderm Ag Mesh [Silver] Other (See Comments)    Blisters, peeling skin    Avelox [Moxifloxacin Hcl In Nacl] Rash   Clindamycin Rash    Patient Measurements: Weight: 129.9 kg (286 lb 6 oz)    Vital Signs: Temp: 97.9 F (36.6 C) (09/17 1024) Temp Source: Oral (09/17 1024) BP: 112/89 (09/17 1035) Pulse Rate: 85 (09/17 1035)  Labs: Recent Labs    12/17/22 1210  HEPARINUNFRC 0.28*    CrCl cannot be calculated (Patient's most recent lab result is older than the maximum 21 days allowed.).  Assessment: Jaime Dean a 67 y.o. male presented with RLE DVT and partially occlusive L femoral DVT. Per patient report, he had a DVT of his L knee 5-10 years ago following a surgery. VVS intervention is anticipated. Pateitn was transferred to [facility] from [facility], and per RN report, he has been on heparin at 15.5 mL/h all night. Pharmacy has been consulted for heparin dosing.   Anticoagulation PTA: Patient not on anticoagulation PTA.  Goal of Therapy:  Heparin level 0.3-0.7 units/ml Monitor platelets by anticoagulation protocol: Yes   Plan:  Continue heparin at current rate of 1,550 units/hour Heparin Level ordered for NOW  Daily HL, CBC Will check Eliquis and Xarelto co-pays    ADDENDUM: Eliquis and Xarelto co-pays both $43. Heparin level returned subtherapeutic at 0.28 with heparin running at 1,550 units/hour.   - Increase heparin infusion to 1,750 units/hour (using Hep DW ~92 kg)  - Heparin bolus via infusion 1,000 units x 1  - F/U 6 hour heparin level   Jani Gravel, PharmD Clinical Pharmacist  12/17/2022 1:53 PM

## 2022-12-17 NOTE — Assessment & Plan Note (Signed)
67 year old male presenting from [facility] hospital with worsening pain, decreased sensation and color change to his right LE in setting of extensive occlusive DVT through the femoropopliteal system and calf veins with no significant antegrade flow on color doppler. Also has partially occlusive DVT to his left distal femoral vein.  -admit to telemetry  -CTA negative for PE performed at outside hospital, reviewed  -vascular surgery consulted: Dr. Edilia Bo  -continue heparin gtt -exam encouraging today. Pedal pulses palpable and foot warm to touch with no cyanosis.  -pain control  -no obvious provoking events. Would do hypercoag work up

## 2022-12-17 NOTE — Progress Notes (Signed)
Pharmacy Consult for heparin Indication: DVT  Allergies  Allergen Reactions   Penicillins Anaphylaxis    Patient took augmentin as an outpatient    Sulfa Antibiotics Swelling   Tegaderm Ag Mesh [Silver] Other (See Comments)    Blisters, peeling skin    Avelox [Moxifloxacin Hcl In Nacl] Rash   Clindamycin Rash    Patient Measurements: Weight: 129.9 kg (286 lb 6 oz)    Vital Signs: Temp: 97.9 F (36.6 C) (09/17 1024) Temp Source: Oral (09/17 1024) BP: 112/89 (09/17 1035) Pulse Rate: 85 (09/17 1035)  Labs: Recent Labs    12/17/22 1210 12/17/22 2009  HEPARINUNFRC 0.28* 0.39    CrCl cannot be calculated (Patient's most recent lab result is older than the maximum 21 days allowed.).  Assessment: Jaime Dean a 67 y.o. male presented with RLE DVT and partially occlusive L femoral DVT. Per patient report, he had a DVT of his L knee 5-10 years ago following a surgery. VVS intervention is anticipated. Pateitn was transferred to [facility] from [facility], and per RN report, he has been on heparin at 15.5 mL/h all night. Pharmacy has been consulted for heparin dosing.   Anticoagulation PTA: Patient not on anticoagulation PTA.  Heparin level 0.39 (therapeutic) on heparin 1750 units/hr. No bleeding or issues with the infusion reported.  Goal of Therapy:  Heparin level 0.3-0.7 units/ml Monitor platelets by anticoagulation protocol: Yes   Plan:  Continue heparin at current rate of 1,750 units/hour Check confirmatory heparin level with AM labs Daily HL, CBC F/u [facility] plans after thrombectomy  Loralee Pacas, PharmD, BCPS 12/17/2022 8:51 PM   Please check AMION for all [facility] phone numbers After 10:00 PM, call Main Pharmacy [phone]

## 2022-12-17 NOTE — Assessment & Plan Note (Signed)
Continue crestor daily

## 2022-12-17 NOTE — Assessment & Plan Note (Signed)
Continue cpap at night

## 2022-12-17 NOTE — Assessment & Plan Note (Signed)
Continue Divalproex, prozac and buspar

## 2022-12-17 NOTE — Assessment & Plan Note (Signed)
A1C 7.4 at [facility] SSI and accuchecks QAC/HS

## 2022-12-18 ENCOUNTER — Encounter (HOSPITAL_COMMUNITY)
Admission: RE | Disposition: A | Discharge status: Home / Self Care | Payer: Self-pay | Source: Other Acute Inpatient Hospital | Attending: Internal Medicine

## 2022-12-18 DIAGNOSIS — E119 Type 2 diabetes mellitus without complications: Secondary | ICD-10-CM | POA: Diagnosis not present

## 2022-12-18 DIAGNOSIS — E871 Hypo-osmolality and hyponatremia: Secondary | ICD-10-CM | POA: Diagnosis not present

## 2022-12-18 DIAGNOSIS — I82403 Acute embolism and thrombosis of unspecified deep veins of lower extremity, bilateral: Secondary | ICD-10-CM | POA: Diagnosis not present

## 2022-12-18 DIAGNOSIS — F319 Bipolar disorder, unspecified: Secondary | ICD-10-CM

## 2022-12-18 DIAGNOSIS — I82491 Acute embolism and thrombosis of other specified deep vein of right lower extremity: Secondary | ICD-10-CM

## 2022-12-18 DIAGNOSIS — G4733 Obstructive sleep apnea (adult) (pediatric): Secondary | ICD-10-CM | POA: Diagnosis not present

## 2022-12-18 DIAGNOSIS — E785 Hyperlipidemia, unspecified: Secondary | ICD-10-CM

## 2022-12-18 HISTORY — PX: PERIPHERAL VASCULAR THROMBECTOMY: CATH118306

## 2022-12-18 HISTORY — PX: PERIPHERAL VASCULAR ULTRASOUND/IVUS: CATH118334

## 2022-12-18 HISTORY — PX: LOWER EXTREMITY VENOGRAPHY: CATH118253

## 2022-12-18 HISTORY — PX: IVC VENOGRAPHY: CATH118301

## 2022-12-18 LAB — SURGICAL PCR SCREEN
MRSA, PCR: NEGATIVE
Staphylococcus aureus: NEGATIVE

## 2022-12-18 LAB — ABO/RH: ABO/RH(D): A NEG

## 2022-12-18 LAB — GLUCOSE, CAPILLARY
Glucose-Capillary: 148 mg/dL — ABNORMAL HIGH (ref 70–99)
Glucose-Capillary: 142 mg/dL — ABNORMAL HIGH (ref 70–99)
Glucose-Capillary: 113 mg/dL — ABNORMAL HIGH (ref 70–99)
Glucose-Capillary: 128 mg/dL — ABNORMAL HIGH (ref 70–99)

## 2022-12-18 LAB — HEMOGLOBIN AND HEMATOCRIT, BLOOD
HCT: 40.8 % (ref 39.0–52.0)
Hemoglobin: 13.1 g/dL (ref 13.0–17.0)

## 2022-12-18 LAB — PREPARE RBC (CROSSMATCH)

## 2022-12-18 SURGERY — PERIPHERAL VASCULAR THROMBECTOMY
Anesthesia: LOCAL | Laterality: Right

## 2022-12-18 MED ORDER — MIDAZOLAM HCL 2 MG/2ML IJ SOLN
INTRAMUSCULAR | Status: AC
  Filled 2022-12-18: qty 2

## 2022-12-18 MED ORDER — HEPARIN SODIUM (PORCINE) 1000 UNIT/ML IJ SOLN
INTRAMUSCULAR | Status: DC | PRN
  Administered 2022-12-18: 5000 [IU] via INTRAVENOUS

## 2022-12-18 MED ORDER — HEPARIN (PORCINE) IN NACL 1000-0.9 UT/500ML-% IV SOLN
INTRAVENOUS | Status: DC | PRN
  Administered 2022-12-18 (×2): 500 mL

## 2022-12-18 MED ORDER — SODIUM CHLORIDE 0.9 % IV SOLN
INTRAVENOUS | Status: DC
Start: 2022-12-18 — End: 2022-12-18

## 2022-12-18 MED ORDER — SODIUM CHLORIDE 0.9% FLUSH
3.0000 mL | Freq: Two times a day (BID) | INTRAVENOUS | Status: DC
  Administered 2022-12-19 (×2): 3 mL via INTRAVENOUS

## 2022-12-18 MED ORDER — LIDOCAINE HCL (PF) 1 % IJ SOLN
INTRAMUSCULAR | Status: AC
  Filled 2022-12-18: qty 30

## 2022-12-18 MED ORDER — SODIUM CHLORIDE 0.9 % IV SOLN: 250.0000 mL | INTRAVENOUS | Status: DC | PRN

## 2022-12-18 MED ORDER — MIDAZOLAM HCL 2 MG/2ML IJ SOLN
INTRAMUSCULAR | Status: DC | PRN
  Administered 2022-12-18: 1 mg via INTRAVENOUS

## 2022-12-18 MED ORDER — FENTANYL CITRATE (PF) 100 MCG/2ML IJ SOLN
INTRAMUSCULAR | Status: AC
  Filled 2022-12-18: qty 2

## 2022-12-18 MED ORDER — SODIUM CHLORIDE 0.9% IV SOLUTION: Freq: Once | INTRAVENOUS | Status: AC

## 2022-12-18 MED ORDER — ACETAMINOPHEN 325 MG PO TABS: 650.0000 mg | ORAL_TABLET | ORAL | Status: DC | PRN

## 2022-12-18 MED ORDER — ONDANSETRON HCL 4 MG/2ML IJ SOLN: 4.0000 mg | Freq: Four times a day (QID) | INTRAMUSCULAR | Status: DC | PRN

## 2022-12-18 MED ORDER — ASPIRIN 81 MG PO TBEC
81.0000 mg | DELAYED_RELEASE_TABLET | Freq: Every day | ORAL | Status: DC
  Administered 2022-12-18 – 2022-12-19 (×2): 81 mg via ORAL
  Filled 2022-12-18 (×2): qty 1

## 2022-12-18 MED ORDER — LIDOCAINE HCL (PF) 1 % IJ SOLN
INTRAMUSCULAR | Status: DC | PRN
  Administered 2022-12-18: 15 mL

## 2022-12-18 MED ORDER — LABETALOL HCL 5 MG/ML IV SOLN: 10.0000 mg | INTRAVENOUS | Status: DC | PRN

## 2022-12-18 MED ORDER — FENTANYL CITRATE (PF) 100 MCG/2ML IJ SOLN
INTRAMUSCULAR | Status: DC | PRN
  Administered 2022-12-18: 50 ug via INTRAVENOUS

## 2022-12-18 MED ORDER — HEPARIN SODIUM (PORCINE) 1000 UNIT/ML IJ SOLN
INTRAMUSCULAR | Status: AC
  Filled 2022-12-18: qty 10

## 2022-12-18 MED ORDER — HYDRALAZINE HCL 20 MG/ML IJ SOLN: 5.0000 mg | INTRAMUSCULAR | Status: DC | PRN

## 2022-12-18 MED ORDER — SODIUM CHLORIDE 0.9% FLUSH: 3.0000 mL | INTRAVENOUS | Status: DC | PRN

## 2022-12-18 MED ORDER — SODIUM CHLORIDE 0.9 % WEIGHT BASED INFUSION: 1.0000 mL/kg/h | INTRAVENOUS | Status: AC

## 2022-12-18 SURGICAL SUPPLY — 14 items
CANISTER PENUMBRA ENGINE (MISCELLANEOUS) IMPLANT
CATH INFINITI VERT 5FR 125CM (CATHETERS) IMPLANT
CATH LIGHTNI FLASH 16XTORQ 100 (CATHETERS) IMPLANT
CATH LIGHTNING FLASH XTORQ 100 (CATHETERS) ×2
CATH VISIONS PV .035 IVUS (CATHETERS) IMPLANT
COVER DOME SNAP 22 D (MISCELLANEOUS) IMPLANT
GLIDEWIRE ADV .035X260CM (WIRE) IMPLANT
KIT MICROPUNCTURE NIT STIFF (SHEATH) IMPLANT
SET ATX-X65L (MISCELLANEOUS) IMPLANT
SHEATH DRYSEAL FLEX 16FR 33CM (SHEATH) IMPLANT
SHEATH PINNACLE 8F 10CM (SHEATH) IMPLANT
SHEATH PROBE COVER 6X72 (BAG) IMPLANT
TRAY PV CATH (CUSTOM PROCEDURE TRAY) IMPLANT
WIRE BENTSON .035X145CM (WIRE) IMPLANT

## 2022-12-18 NOTE — Progress Notes (Addendum)
  Progress Note    12/18/2022 8:56 AM Hospital Day 2  Subjective:  still having pain/swelling right leg  afebrile  Vitals:   12/18/22 0547 12/18/22 0724  BP: (!) 140/70 107/66  Pulse: 85 80  Resp: 15 17  Temp: 98.3 F (36.8 C) 98.2 F (36.8 C)  SpO2: 99% 97%    Physical Exam: General:  no distress Lungs:  non labored Extremities:  right leg with significant swelling  CBC    Component Value Date/Time   WBC 13.8 (H) 12/18/2022 0450   RBC 5.00 12/18/2022 0450   HGB 12.9 (L) 12/18/2022 0450   HCT 40.9 12/18/2022 0450   PLT 237 12/18/2022 0450   MCV 81.8 12/18/2022 0450   MCH 25.8 (L) 12/18/2022 0450   MCHC 31.5 12/18/2022 0450   RDW 14.6 12/18/2022 0450   LYMPHSABS 2.6 03/21/2010 0454   MONOABS 2.1 (H) 03/21/2010 0454   EOSABS 0.3 03/21/2010 0454   BASOSABS 0.0 03/21/2010 0454    BMET    Component Value Date/Time   NA 135 12/18/2022 0450   K 4.4 12/18/2022 0450   CL 99 12/18/2022 0450   CO2 27 12/18/2022 0450   GLUCOSE 184 (H) 12/18/2022 0450   BUN 18 12/18/2022 0450   CREATININE 1.04 12/18/2022 0450   CALCIUM 9.4 12/18/2022 0450   GFRNONAA >60 12/18/2022 0450   GFRAA  03/20/2010 0720    >60        The eGFR has been calculated using the MDRD equation. This calculation has not been validated in all clinical situations. eGFR's persistently <60 mL/min signify possible Chronic Kidney Disease.    INR No results found for: "INR"   Intake/Output Summary (Last 24 hours) at 12/18/2022 0856 Last data filed at 12/18/2022 0000 Gross per 24 hour  Intake 240 ml  Output 2280 ml  Net -2040 ml     Assessment/Plan:  67 y.o. male with   Hospital Day 1  -extensive right lower extremity DVT which extends into the external iliac vein. Based on his duplex this did not extend into the common iliac vein or IVC.  -plan for PV lab today for mechanical thrombectomy RLE to lower risk of long term postphlebitic syndrome -pt is npo -please continue heparin  gtt   Doreatha Massed, PA-C [facility] [phone] 12/18/2022 8:56 AM

## 2022-12-18 NOTE — Progress Notes (Signed)
[facility]                                                                               Jaime Dean, is a 67 y.o. male, DOB - 12-24-1955, ONG:[id number] Admit date - 12/17/2022    Outpatient Primary MD for the patient is Dough, Doris Cheadle, MD  LOS - 1  days    Brief summary   67 y.o. male with medical history significant of T2DM, COPD, depression/anxiety, arthritis, OSA on cpap  who presented to ED with complaints of bilateral LE swelling. Over the weekend he noticed his legs became acutely swollen, right worse than left. He had pain as well that prompted him to go to ED.  He was admitted to [facility] for heparin gtt on 12/15/22 and discharged 12/17/22 to [facility] due to physical exam findings with decreased sensation to the right LE, leg red and cyanosis of feet which had worsened overnight. Dr. Randie Heinz was called who accepted patient to [facility].   Assessment & Plan    Assessment and Plan: * Acute DVT (deep venous thrombosis) (HCC) 67 year old male presenting from [facility] hospital with worsening pain, decreased sensation and color change to his right LE in setting of extensive occlusive DVT through the femoropopliteal system and calf veins with no significant antegrade flow on color doppler. Also has partially occlusive DVT to his left distal femoral vein.  -CTA negative for PE performed at outside hospital, reviewed  -vascular surgery consulted and plan for mechanical thrombectomy to lower risk of long term post phlebitic syndrome. Continue with heparin gtt.  -exam encouraging today. Pedal pulses palpable and foot warm to touch with no cyanosis.  - pain control.  - follow up hypercoagulable work up.   Cellulitis of left lower leg Pain, tenderness, erythema and edema on LLL Keflex TID (will ask pharmacy to see if cephalosporins okay. Tolerates augmentin)   Hyponatremia Sodium stable around 132, and he remains asymptomatic.   Type 2 diabetes mellitus without complication, without  long-term current use of insulin (HCC) A1c is 7.4.  CBG (last 3)  Recent Labs    12/17/22 1610 12/17/22 2106 12/18/22 0722  GLUCAP 134* 136* 148*   Resume SSI.   Bipolar I disorder with depression/PTSD Continue Divalproex, prozac and buspar   HLD (hyperlipidemia) Resume crestor.   Chronic back pain On lyrica with injections PRN   Obstructive sleep apnea Continue cpap at night   Mild Leukocytosis ? Cellulitis of the LLE.     Estimated body mass index is 47.66 kg/m as calculated from the following:   Height as of 11/18/13: 5\' 5"  (1.651 m).   Weight as of this encounter: 129.9 kg.  Code Status: full code.  DVT Prophylaxis:  Heparin   Level of Care: Level of care: Telemetry Medical Family Communication: Updated patient's wife at bedside.   Disposition Plan:     Remains inpatient appropriate:  Mechanical thrombectomy of the RLE DVT.  Procedures:  Mechanical thrombectomy of the RLE DVT.  Consultants:   Vascular surgery   Antimicrobials:   Anti-infectives (From admission, onward)    Start     Dose/Rate Route Frequency Ordered Stop   12/17/22 1415  cephALEXin (KEFLEX) capsule 500 mg       Note to Pharmacy: Has taken augmentin fine. Please see if he has had a cephalosporin   500 mg Oral Every 8 hours 12/17/22 1319          Medications  Scheduled Meds:  cephALEXin  500 mg Oral Q8H   divalproex  500 mg Oral TID   FLUoxetine  40 mg Oral Daily   insulin aspart  0-20 Units Subcutaneous TID WC   linagliptin  5 mg Oral Daily   pregabalin  200 mg Oral Daily   rosuvastatin  20 mg Oral Daily   Continuous Infusions:  sodium chloride 75 mL/hr at 12/18/22 0551   heparin 1,750 Units/hr (12/18/22 0553)   PRN Meds:.acetaminophen, busPIRone, morphine injection, ondansetron **OR** ondansetron (ZOFRAN) IV, oxyCODONE    Subjective:   Jaime Dean was seen and examined today.  Still having significant pain in the right lower extremity.   Objective:   Vitals:    12/17/22 2110 12/17/22 2238 12/18/22 0547 12/18/22 0724  BP: 127/82  (!) 140/70 107/66  Pulse:  89 85 80  Resp: 20 18 15 17   Temp: 98.3 F (36.8 C)  98.3 F (36.8 C) 98.2 F (36.8 C)  TempSrc: Oral   Oral  SpO2: 94% 95% 99% 97%  Weight:        Intake/Output Summary (Last 24 hours) at 12/18/2022 1039 Last data filed at 12/18/2022 0900 Gross per 24 hour  Intake 240 ml  Output 2830 ml  Net -2590 ml   Filed Weights   12/17/22 1300  Weight: 129.9 kg     Exam General: Alert and oriented x 3, NAD Cardiovascular: S1 S2 auscultated, no murmurs, RRR Respiratory: Clear to auscultation bilaterally, no wheezing, rales or rhonchi Gastrointestinal: Soft, nontender, nondistended, + bowel sounds Ext: right lower extremity pain and swelling.  Neuro: AAOx3,  Skin: No rashes Psych: Normal affect and demeanor, alert and oriented x3    Data Reviewed:  I have personally reviewed following labs and imaging studies   CBC Lab Results  Component Value Date   WBC 13.8 (H) 12/18/2022   RBC 5.00 12/18/2022   HGB 12.9 (L) 12/18/2022   HCT 40.9 12/18/2022   MCV 81.8 12/18/2022   MCH 25.8 (L) 12/18/2022   PLT 237 12/18/2022   MCHC 31.5 12/18/2022   RDW 14.6 12/18/2022   LYMPHSABS 2.6 03/21/2010   MONOABS 2.1 (H) 03/21/2010   EOSABS 0.3 03/21/2010   BASOSABS 0.0 03/21/2010     Last metabolic panel Lab Results  Component Value Date   NA 135 12/18/2022   K 4.4 12/18/2022   CL 99 12/18/2022   CO2 27 12/18/2022   BUN 18 12/18/2022   CREATININE 1.04 12/18/2022   GLUCOSE 184 (H) 12/18/2022   GFRNONAA >60 12/18/2022   GFRAA  03/20/2010    >60        The eGFR has been calculated using the MDRD equation. This calculation has not been validated in all clinical situations. eGFR's persistently <60 mL/min signify possible Chronic Kidney Disease.   CALCIUM 9.4 12/18/2022   PROT 6.3 03/20/2010   ALBUMIN 3.7 03/20/2010   BILITOT 0.7 03/20/2010   ALKPHOS 50 03/20/2010   AST 22  03/20/2010   ALT 40 03/20/2010   ANIONGAP 9 12/18/2022    CBG (last 3)  Recent Labs    12/17/22 1610 12/17/22 2106 12/18/22 0722  GLUCAP 134* 136* 148*      Coagulation Profile: No results for input(s): "  INR", "PROTIME" in the last 168 hours.   Radiology Studies: VAS [place] LOWER EXTREMITY VENOUS (DVT)  Result Date: 12/17/2022  Lower Venous DVT Study Patient Name:  MORRISON WINTHROP  Date of Exam:   12/17/2022 Medical Rec #: [id number]       Accession #:    [id number] Date of Birth: [birth year]       Patient Gender: M Patient Age:   27 years Exam Location:  [facility] Procedure:      VAS [place] LOWER EXTREMITY VENOUS (DVT) Referring Phys: Cristal Deer DICKSON --------------------------------------------------------------------------------  Indications: Swelling, and Pain.  Risk Factors: DVT Remote history of DVT. Limitations: Body habitus, poor ultrasound/tissue interface and patient pain tolerance. Comparison Study: No prior studies. Performing Technologist: Chanda Busing RVT  Examination Guidelines: A complete evaluation includes B-mode imaging, spectral Doppler, color Doppler, and power Doppler as needed of all accessible portions of each vessel. Bilateral testing is considered an integral part of a complete examination. Limited examinations for reoccurring indications may be performed as noted. The reflux portion of the exam is performed with the patient in reverse Trendelenburg.  +---------+---------------+---------+-----------+----------+--------------+ RIGHT    CompressibilityPhasicitySpontaneityPropertiesThrombus Aging +---------+---------------+---------+-----------+----------+--------------+ CFV      None           No       No                   Acute          +---------+---------------+---------+-----------+----------+--------------+ FV Prox  None           No       No                   Acute           +---------+---------------+---------+-----------+----------+--------------+ FV Mid   None           No       No                   Acute          +---------+---------------+---------+-----------+----------+--------------+ FV DistalNone           No       No                   Acute          +---------+---------------+---------+-----------+----------+--------------+ PFV      None           No       No                   Acute          +---------+---------------+---------+-----------+----------+--------------+ POP      None           No       No                   Acute          +---------+---------------+---------+-----------+----------+--------------+ PTV      None                                         Acute          +---------+---------------+---------+-----------+----------+--------------+ PERO     None  Acute          +---------+---------------+---------+-----------+----------+--------------+ Gastroc  Full                                                        +---------+---------------+---------+-----------+----------+--------------+ EIV                     No       No                   Acute          +---------+---------------+---------+-----------+----------+--------------+ CIV                     Yes      Yes                                 +---------+---------------+---------+-----------+----------+--------------+ The prox, mid, and distal segments of the IVC appear patent with color and doppler flow.  +----+---------------+---------+-----------+----------+--------------+ LEFTCompressibilityPhasicitySpontaneityPropertiesThrombus Aging +----+---------------+---------+-----------+----------+--------------+ CFV Full           Yes      Yes                                 +----+---------------+---------+-----------+----------+--------------+    Summary: RIGHT: - Findings consistent with acute deep  vein thrombosis involving the right external iliac vein, right common femoral vein, right femoral vein, right proximal profunda vein, right popliteal vein, right posterior tibial veins, and right peroneal veins.  - No cystic structure found in the popliteal fossa.  LEFT: - No evidence of common femoral vein obstruction.   *See table(s) above for measurements and observations. Electronically signed by Waverly Ferrari MD on 12/17/2022 at 6:01:00 PM.    Final        Kathlen Mody M.D. [facility] 12/18/2022, 10:39 AM  Available via Epic secure chat 7am-7pm After 7 pm, please refer to night coverage provider listed on amion.

## 2022-12-18 NOTE — Progress Notes (Signed)
Pharmacy Consult for heparin Indication: DVT  Allergies  Allergen Reactions   Penicillins Anaphylaxis    Patient took augmentin as an outpatient    Sulfa Antibiotics Swelling   Tegaderm Ag Mesh [Silver] Other (See Comments)    Blisters, peeling skin    Avelox [Moxifloxacin Hcl In Nacl] Rash   Clindamycin Rash    Patient Measurements: Weight: 129.9 kg (286 lb 6 oz)    Vital Signs: Temp: 98.3 F (36.8 C) (09/18 0547) Temp Source: Oral (09/17 2110) BP: 140/70 (09/18 0547) Pulse Rate: 85 (09/18 0547)  Labs: Recent Labs    12/17/22 1210 12/17/22 2009 12/18/22 0450  HGB  --   --  12.9*  HCT  --   --  40.9  PLT  --   --  237  HEPARINUNFRC 0.28* 0.39 0.43  CREATININE  --   --  1.04    CrCl cannot be calculated (Unknown ideal weight.).  Assessment: Jaime Dean a 67 y.o. male presented with RLE DVT and partially occlusive L femoral DVT. Per patient report, he had a DVT of his L knee 5-10 years ago following a surgery. Pharmacy has been consulted for heparin dosing.   Anticoagulation PTA: Patient not on anticoagulation PTA.  Confirmed heparin level 0.43 (therapeutic) on heparin 1750 units/hr. No bleeding or issues with the infusion reported. CBC stable. Patient on schedule for thrombectomy today, per [facility] no need to pause heparin drip prior to procedure.   Goal of Therapy:  Heparin level 0.3-0.7 units/ml Monitor platelets by anticoagulation protocol: Yes   Plan:  Continue heparin at current rate of 1,750 units/hour Daily HL, CBC F/u [facility] plans after thrombectomy  Gwynn Burly, PharmD PGY-1 Pharmacy Resident

## 2022-12-18 NOTE — Op Note (Signed)
Patient name: Jaime Dean MRN: [id number] DOB: 1955/12/20 Sex: male  12/18/2022 Pre-operative Diagnosis: Left iliofemoral deep venous thrombosis Post-operative diagnosis: Left ilio caval, iliofemoral deep venous thrombosis Surgeon:  Victorino Sparrow, MD Procedure Performed: 1.  Ultrasound-guided micropuncture access of the right popliteal vein in retrograde fashion 2.  Venogram of the left lower extremity, iliac vein, inferior vena cava 3.  Intravascular ultrasound right popliteal vein, femoral vein, common femoral vein, external iliac vein, common iliac vein, inferior vena cava 4.  Percutaneous mechanical suction thrombectomy using the 16 French penumbra system of the popliteal vein, femoral vein, common femoral vein, external iliac vein, common iliac vein, inferior vena cava 5.  Moderate sedation time 52 minutes, contrast volume 125 mL 6.  Venotomy managed with Monocryl suture.   Indications: Patient is a 67 year old male with prior history of left lower extremity DVT presumed to be provoked status post surgery with subsequent IVC filter placement in 2011.  He presented to the ED with new onset right lower extremity swelling with ultrasound demonstrating DVT extending from the external iliac vein through the tibial vessels.  He is very symptomatic, unable to walk.  After discussing the risks and benefits of mechanical venous thrombectomy in an effort to decrease clot burden and improve symptoms, the patient elected to proceed.  Findings:  Occlusive thrombus in the right popliteal vein, femoral vein, common femoral vein, external iliac vein, common iliac vein, with partial occlusion in the inferior vena cava.  Thrombus to the level of the inferior vena cava filter.  Thrombus also appreciated in the left common iliac vein, with a flow channel present.   Procedure:  The patient was identified in the holding area and taken to room 8.  The patient was then placed prone on the table.  He was  prepped and draped in standard fashion a timeout was called.  An ultrasound was used to evaluate the right popliteal vein.  This was accessed in antegrade fashion, and a wire was brought into the superior vena cava.  Next, right lower extremity venography followed see results above.  I elected to attempt intervention on the above.  The patient was heparinized, and a 16 French dry seal sheath was placed in the right popliteal vein.  Next, intravascular ultrasound was used to evaluate the entirety of the left leg into the inferior vena cava as venography proved difficult due to the level of thrombus.  Next, the 16 French penumbra catheter was brought onto the field.  Multiple passes were made extending from the right popliteal vein to the inferior vena cava filter.  A significant amount of thrombus was removed.  Final venography demonstrated significant improvement with significant debulking of thrombus throughout the left venous system including the left iliac and inferior vena cava.  The filter was declotted as well. This was confirmed with use of intravascular ultrasound.  There was no compression requiring venoplasty.  Wires and sheaths were removed and the venotomy was managed with a Monocryl suture at the level of the skin.   Impression: Successful recanalization of the right popliteal vein, femoral vein, common femoral vein, external iliac vein, common iliac vein, inferior vena cava.  Residual thrombus appreciated in the left common iliac vein, however the patient was asymptomatic in the left leg.  My plan is to see him in 1 month's time with repeat venous duplex of the IVC, bilateral lower extremities.   Victorino Sparrow MD [facility] [place] Office: [phone]

## 2022-12-18 NOTE — Plan of Care (Signed)
  Problem: Education: Goal: Knowledge of General Education information will improve Description: Including pain rating scale, medication(s)/side effects and non-pharmacologic comfort measures Outcome: Progressing   Problem: Health Behavior/Discharge Planning: Goal: Ability to manage health-related needs will improve Outcome: Progressing   Problem: Clinical Measurements: Goal: Ability to maintain clinical measurements within normal limits will improve Outcome: Progressing Goal: Will remain free from infection Outcome: Progressing Goal: Diagnostic test results will improve Outcome: Progressing Goal: Respiratory complications will improve Outcome: Progressing Goal: Cardiovascular complication will be avoided Outcome: Progressing   Problem: Elimination: Goal: Will not experience complications related to bowel motility Outcome: Progressing Goal: Will not experience complications related to urinary retention Outcome: Progressing   Problem: Health Behavior/Discharge Planning: Goal: Ability to identify and utilize available resources and services will improve Outcome: Progressing Goal: Ability to manage health-related needs will improve Outcome: Progressing   Problem: Metabolic: Goal: Ability to maintain appropriate glucose levels will improve Outcome: Progressing

## 2022-12-19 ENCOUNTER — Other Ambulatory Visit (HOSPITAL_COMMUNITY): Payer: Self-pay

## 2022-12-19 ENCOUNTER — Encounter (HOSPITAL_COMMUNITY): Payer: Self-pay | Admitting: Vascular Surgery

## 2022-12-19 DIAGNOSIS — I82403 Acute embolism and thrombosis of unspecified deep veins of lower extremity, bilateral: Secondary | ICD-10-CM | POA: Diagnosis not present

## 2022-12-19 DIAGNOSIS — E119 Type 2 diabetes mellitus without complications: Secondary | ICD-10-CM | POA: Diagnosis not present

## 2022-12-19 DIAGNOSIS — F319 Bipolar disorder, unspecified: Secondary | ICD-10-CM | POA: Diagnosis not present

## 2022-12-19 DIAGNOSIS — G4733 Obstructive sleep apnea (adult) (pediatric): Secondary | ICD-10-CM | POA: Diagnosis not present

## 2022-12-19 LAB — LIPID PANEL
Cholesterol: 135 mg/dL (ref 0–200)
HDL: 41 mg/dL (ref >40)
LDL Cholesterol: 78 mg/dL (ref 0–99)
Total CHOL/HDL Ratio: 3.3 RATIO
Triglycerides: 81 mg/dL (ref <150)
VLDL: 16 mg/dL (ref 0–40)

## 2022-12-19 LAB — TYPE AND SCREEN
ABO/RH(D): A NEG
Antibody Screen: NEGATIVE
Unit division: 0

## 2022-12-19 LAB — GLUCOSE, CAPILLARY
Glucose-Capillary: 130 mg/dL — ABNORMAL HIGH (ref 70–99)
Glucose-Capillary: 136 mg/dL — ABNORMAL HIGH (ref 70–99)

## 2022-12-19 LAB — BPAM RBC
Blood Product Expiration Date: 202410072359
ISSUE DATE / TIME: 202409181543
Unit Type and Rh: 600

## 2022-12-19 LAB — CBC
HCT: 37.6 % — ABNORMAL LOW (ref 39.0–52.0)
Hemoglobin: 12.2 g/dL — ABNORMAL LOW (ref 13.0–17.0)
MCH: 26.6 pg (ref 26.0–34.0)
MCHC: 32.4 g/dL (ref 30.0–36.0)
MCV: 82.1 fL (ref 80.0–100.0)
Platelets: 216 10*3/uL (ref 150–400)
RBC: 4.58 MIL/uL (ref 4.22–5.81)
RDW: 14.9 % (ref 11.5–15.5)
WBC: 10.3 10*3/uL (ref 4.0–10.5)
nRBC: 0 % (ref 0.0–0.2)

## 2022-12-19 LAB — MISC LABCORP TEST (SEND OUT): Labcorp test code: 83935

## 2022-12-19 MED ORDER — ROSUVASTATIN CALCIUM 20 MG PO TABS
20.0000 mg | ORAL_TABLET | Freq: Every day | ORAL | 0 refills | Status: DC
Start: 2022-12-19 — End: 2022-12-19
  Filled 2022-12-19: qty 30, 30d supply, fill #0

## 2022-12-19 MED ORDER — OXYCODONE HCL 5 MG PO TABS
5.0000 mg | ORAL_TABLET | Freq: Four times a day (QID) | ORAL | 0 refills | Status: AC | PRN
Start: 2022-12-19 — End: 2022-12-24
  Filled 2022-12-19: qty 20, 5d supply, fill #0

## 2022-12-19 MED ORDER — APIXABAN 5 MG PO TABS
5.0000 mg | ORAL_TABLET | Freq: Two times a day (BID) | ORAL | Status: DC
  Administered 2022-12-19: 5 mg via ORAL
  Filled 2022-12-19: qty 1

## 2022-12-19 MED ORDER — APIXABAN 5 MG PO TABS
5.0000 mg | ORAL_TABLET | Freq: Two times a day (BID) | ORAL | 3 refills | Status: AC
Start: 2022-12-19 — End: 2023-12-24
  Filled 2022-12-19: qty 60, 30d supply, fill #0

## 2022-12-19 MED ORDER — ASPIRIN 81 MG PO TBEC
81.0000 mg | DELAYED_RELEASE_TABLET | Freq: Every day | ORAL | 3 refills | Status: DC
Start: 2022-12-20 — End: 2023-11-12
  Filled 2022-12-19: qty 120, 120d supply, fill #0

## 2022-12-19 MED ORDER — ROSUVASTATIN CALCIUM 20 MG PO TABS
20.0000 mg | ORAL_TABLET | Freq: Every day | ORAL | 3 refills | Status: AC
  Filled 2022-12-19: qty 30, 30d supply, fill #0

## 2022-12-19 NOTE — Progress Notes (Signed)
Nursing Discharge Note   Name: Jaime Dean MRN: [id number] DOB: 1955-06-19    Admit Date: 12/17/2022  Discharge Date: 12/19/2022  Jaime Dean is to be discharged home per MD order.  AVS completed. Reviewed with patient and family at bedside. Highlighted copy provided for patient to take home.  Patient/caregiver able to verbalize understanding of discharge instructions. PIV removed. Patient stable upon discharge.   Discharge Instructions     Diet - low sodium heart healthy   Complete by: As directed    Discharge instructions   Complete by: As directed    Please follow up with PCP in one week.  Please follow up with vascular as recommended.       Allergies as of 12/19/2022       Reactions   Penicillins Anaphylaxis   Patient took augmentin as an outpatient   Sulfa Antibiotics Swelling   Tegaderm Ag Mesh [silver] Other (See Comments)   Blisters, peeling skin    Avelox [moxifloxacin Hcl In Nacl] Rash   Clindamycin Rash        Medication List     STOP taking these medications    amoxicillin-clavulanate 875-125 MG tablet Commonly known as: AUGMENTIN   metFORMIN 1000 MG (MOD) 24 hr tablet Commonly known as: GLUMETZA       TAKE these medications    albuterol 108 (90 Base) MCG/ACT inhaler Commonly known as: VENTOLIN HFA Inhale 3 puffs into the lungs as needed for wheezing or shortness of breath.   aspirin EC 81 MG tablet Take 1 tablet (81 mg total) by mouth daily. Swallow whole. Start taking on: December 20, 2022   busPIRone 10 MG tablet Commonly known as: BUSPAR Take 10-40 mg by mouth as needed (anxiety).   divalproex 500 MG 24 hr tablet Commonly known as: DEPAKOTE ER Take 500 mg by mouth in the morning, at noon, and at bedtime.   Eliquis 5 MG Tabs tablet Generic drug: apixaban Take 1 tablet (5 mg total) by mouth 2 (two) times daily.   fexofenadine 180 MG tablet Commonly known as: ALLEGRA Take 180 mg by mouth daily.   FLUoxetine 40 MG  capsule Commonly known as: PROZAC Take 40 mg by mouth daily.   Januvia 100 MG tablet Generic drug: sitaGLIPtin Take 100 mg by mouth daily.   multivitamin-iron-minerals-folic acid chewable tablet Chew 1 tablet by mouth daily.   oxyCODONE 5 MG immediate release tablet Commonly known as: Oxy IR/ROXICODONE Take 1 tablet (5 mg total) by mouth every 6 (six) hours as needed for up to 5 days for moderate pain.   pregabalin 200 MG capsule Commonly known as: LYRICA Take 200 mg by mouth daily.   rosuvastatin 20 MG tablet Commonly known as: CRESTOR Take 1 tablet (20 mg total) by mouth daily.         Discharge Instructions/ Education: Discharge instructions given to patient/family with verbalized understanding. Discharge education completed with patient/family including: follow up instructions, medication list, discharge activities, and limitations if indicated. Additional discharge instructions as indicated by discharging provider also reviewed. Patient and family able to verbalize understanding, all questions fully answered. Patient instructed to return to Emergency Department, call 911, or call MD for any changes in condition.  Patient escorted via wheelchair to lobby and discharged home via private automobile.

## 2022-12-19 NOTE — Progress Notes (Signed)
Infusion complete at 1845.  Patient had no reaction to blood transfusion.      12/18/22 1845  Vitals  Vital Signs Type (Include Temp, Pulse, RR, and B/P) Blood transfusion completion (within 30 minutes)  Temp 98.2 F (36.8 C)  Temp Source Oral  Pulse Rate 87  ECG Heart Rate 87  Resp 18  BP (!) 113/59  Oxygen Therapy  SpO2 97 %  O2 Device Room [facility]

## 2022-12-19 NOTE — Progress Notes (Signed)
[facility] [place]  Subjective  - No new complaint   Objective 119/62 80 97.8 F (36.6 C) (Axillary) 16 96%  Intake/Output Summary (Last 24 hours) at 12/19/2022 0651 Last data filed at 12/19/2022 0407 Gross per 24 hour  Intake --  Output 2400 ml  Net -2400 ml    Right LE improved edema with soft compartments Suture to be removed by RN and compression placed 20-30 mmhg B LE with elevation alternating with ambulation.  Assessment/Planning: POD # 1 mechanical thrombectomy to lower his risk of long-term postphlebitic syndrome.  He has hx of DVT in 2009 after having a laparoscopy surgery.   Plan f/u with Dr. Karin Lieu in 1 month for exam and venous duplex of the IVC, bilateral lower extremities.  The dressing was removed, the stitch will be removed from popliteal space and thigh 20-30 mm hg compression will be placed on him today.  Elevation alternated with exercise is recommended daily.    Stable for discharge from a vascular point of view.   Mosetta Pigeon 12/19/2022 6:51 AM --  Laboratory Lab Results: Recent Labs    12/18/22 0450 12/18/22 2125  WBC 13.8*  --   HGB 12.9* 13.1  HCT 40.9 40.8  PLT 237  --    BMET Recent Labs    12/18/22 0450  NA 135  K 4.4  CL 99  CO2 27  GLUCOSE 184*  BUN 18  CREATININE 1.04  CALCIUM 9.4    COAG No results found for: "INR", "PROTIME" No results found for: "PTT"

## 2022-12-19 NOTE — Progress Notes (Signed)
Stitch behind the right knee removed per order.  Compression stockings ordered and delivered to bedside.  Placed on patient at this time. Educated patient and wife Jaime Dean on use of compression stockings.  Patient educated on importance of wearing compression stockings.   Dr. Blake Divine notified that patient does not have PRN oxycodone for home use and has been requiring medication for pain control.

## 2022-12-19 NOTE — Progress Notes (Signed)
  VASCULAR SURGERY ASSESSMENT & PLAN:   POD 1 MECHANICAL THROMBECTOMY OF RIGHT LOWER EXTREMITY DVT: The patient is doing well.  His dressing can come off today and he should continue aggressive leg elevation.  His DOAC can start today.  He can be discharged from our standpoint.  We will arrange follow-up.  SUBJECTIVE:   No specific complaints.  PHYSICAL EXAM:   Vitals:   12/18/22 2152 12/18/22 2239 12/18/22 2357 12/19/22 0300  BP:  (!) 109/49 119/62   Pulse:  82 80   Resp: 20  20 16   Temp:   (!) 97.5 F (36.4 C) 97.8 F (36.6 C)  TempSrc:   Oral Axillary  SpO2:  96%    Weight:       His dressing is dry.  LABS:   Lab Results  Component Value Date   WBC 13.8 (H) 12/18/2022   HGB 13.1 12/18/2022   HCT 40.8 12/18/2022   MCV 81.8 12/18/2022   PLT 237 12/18/2022   Lab Results  Component Value Date   CREATININE 1.04 12/18/2022   CBG (last 3)  Recent Labs    12/18/22 1552 12/18/22 2106 12/19/22 0617  GLUCAP 113* 128* 130*    PROBLEM LIST:    Principal Problem:   Acute DVT (deep venous thrombosis) (HCC) Active Problems:   Obstructive sleep apnea   HLD (hyperlipidemia)   Type 2 diabetes mellitus without complication, without long-term current use of insulin (HCC)   Bipolar I disorder with depression/PTSD   Chronic back pain   Hyponatremia   Cellulitis of left lower leg   CURRENT MEDS:    aspirin EC  81 mg Oral Daily   cephALEXin  500 mg Oral Q8H   divalproex  500 mg Oral TID   FLUoxetine  40 mg Oral Daily   insulin aspart  0-20 Units Subcutaneous TID WC   linagliptin  5 mg Oral Daily   pregabalin  200 mg Oral Daily   rosuvastatin  20 mg Oral Daily   sodium chloride flush  3 mL Intravenous Q12H    Waverly Ferrari Office: [phone] 12/19/2022

## 2022-12-19 NOTE — Progress Notes (Signed)
Pharmacy Consult for heparin > Eliquis Indication: DVT  Allergies  Allergen Reactions   Penicillins Anaphylaxis    Patient took augmentin as an outpatient    Sulfa Antibiotics Swelling   Tegaderm Ag Mesh [Silver] Other (See Comments)    Blisters, peeling skin    Avelox [Moxifloxacin Hcl In Nacl] Rash   Clindamycin Rash    Patient Measurements: Weight: 129.9 kg (286 lb 6 oz)    Vital Signs: Temp: 97.8 F (36.6 C) (09/19 0300) Temp Source: Axillary (09/19 0300) BP: 119/72 (09/19 0750) Pulse Rate: 84 (09/19 0750)  Labs: Recent Labs    12/17/22 1210 12/17/22 2009 12/18/22 0450 12/18/22 2125  HGB  --   --  12.9* 13.1  HCT  --   --  40.9 40.8  PLT  --   --  237  --   HEPARINUNFRC 0.28* 0.39 0.43  --   CREATININE  --   --  1.04  --     CrCl cannot be calculated (Unknown ideal weight.).  Assessment: Jaime Dean a 67 y.o. male presented with RLE DVT and partially occlusive L femoral DVT. Per patient report, he had a DVT of his L knee 5-10 years ago following a surgery.  Now s/p successful mechanical thrombectomy of RLE with residual thrombus noted in the L common iliac vein.  Pharmacy consulted to transition from heparin to Eliquis with no load per VVS.  Anticoagulation PTA: Patient not on anticoagulation PTA.   Plan:  STOP heparin infusion START Eliquis 5 mg twice daily Copay = $43  Trixie Rude, PharmD Clinical Pharmacist 12/19/2022  8:36 AM

## 2022-12-19 NOTE — Progress Notes (Signed)
   12/19/22 0027  BiPAP/CPAP/SIPAP  BiPAP/CPAP/SIPAP Pt Type Adult  BiPAP/CPAP/SIPAP DREAMSTATIOND  Mask Type Full face mask  Mask Size Medium  Respiratory Rate 18 breaths/min  Pressure Support  (pressure of 5)  FiO2 (%) 21 %  Patient Home Equipment No  Auto Titrate No

## 2022-12-19 NOTE — Progress Notes (Signed)
Patient and wife verbalized understanding of dc instructions.ccmd notified of dc order. Piv site unremarkable. Vss . No complaints. Toc meds  and belongings with patient.Now waiting on ted hose.

## 2022-12-20 NOTE — Discharge Summary (Signed)
the popliteal vein, femoral vein, common femoral vein, external iliac vein, common iliac vein, inferior vena cava 5.  Moderate sedation time 52 minutes, contrast volume 125 mL 6.  Venotomy managed with Monocryl suture.   Indications: Patient is a 67 year old male with prior history of left lower extremity DVT presumed to be provoked status post surgery with subsequent IVC filter placement in 2011.  He presented to the ED with new onset right lower extremity swelling with ultrasound demonstrating DVT extending from the external iliac vein through the tibial vessels.  He is very symptomatic, unable to walk.  After discussing the risks and benefits of mechanical venous thrombectomy in an effort to decrease clot burden and improve symptoms, the patient elected to proceed.  Findings: Occlusive thrombus in the right popliteal vein, femoral vein, common femoral vein, external iliac vein, common iliac vein, with partial occlusion in the inferior vena cava.  Thrombus to the level of the inferior vena cava filter.  Thrombus also appreciated in the  left common iliac vein, with a flow channel present.             Procedure:  The patient was identified in the holding area and taken to room 8.  The patient was then placed prone on the table.  He was prepped and draped in standard fashion a timeout was called.  An ultrasound was used to evaluate the right popliteal vein.  This was accessed in antegrade fashion, and a wire was brought into the superior vena cava.  Next, right lower extremity venography followed see results above.  I elected to attempt intervention on the above.  The patient was heparinized, and a 16 French dry seal sheath was placed in the right popliteal vein.  Next, intravascular ultrasound was used to evaluate the entirety of the left leg into the inferior vena cava as venography proved difficult due to the level of thrombus.  Next, the 16 French penumbra catheter was brought onto the field.  Multiple passes were made extending from the right popliteal vein to the inferior vena cava filter.  A significant amount of thrombus was removed.  Final venography demonstrated significant improvement with significant debulking of thrombus throughout the left venous system including the left iliac and inferior vena cava.  The filter was declotted as well. This was confirmed with use of intravascular ultrasound.  There was no compression requiring venoplasty.  Wires and sheaths were removed and the venotomy was managed with a Monocryl suture at the level of the skin.   Impression: Successful recanalization of the right popliteal vein, femoral vein, common femoral vein, external iliac vein, common iliac vein, inferior vena cava.  Residual thrombus appreciated in the left common iliac vein, however the patient was asymptomatic in the left leg.  My plan is to see him in 1 month's time with repeat venous duplex of the IVC, bilateral lower extremities.   Victorino Sparrow MD [facility] Office: [phone]   VAS [place] LOWER  EXTREMITY VENOUS (DVT)  Result Date: 12/17/2022  Lower Venous DVT Study Patient Name:  Jaime Dean  Date of Exam:   12/17/2022 Medical Rec #: [id number]       Accession #:    [id number] Date of Birth: May 30, 1955       Patient Gender: M Patient Age:   81 years Exam Location:  [facility] Procedure:      VAS [place] LOWER EXTREMITY VENOUS (DVT) Referring Phys: Cristal Deer DICKSON --------------------------------------------------------------------------------  Indications: Swelling,  875-125 MG tablet Commonly known as: AUGMENTIN   metFORMIN 1000 MG (MOD) 24 hr tablet Commonly known as: GLUMETZA       TAKE these medications    albuterol 108 (90 Base) MCG/ACT inhaler Commonly known as: VENTOLIN HFA Inhale 3 puffs into the lungs as needed for wheezing or shortness of breath.   aspirin EC 81 MG tablet Take 1 tablet (81 mg total) by mouth daily. Swallow whole.   busPIRone 10 MG tablet Commonly known as: BUSPAR Take 10-40 mg by mouth as needed (anxiety).   divalproex 500 MG 24 hr  tablet Commonly known as: DEPAKOTE ER Take 500 mg by mouth in the morning, at noon, and at bedtime.   Eliquis 5 MG Tabs tablet Generic drug: apixaban Take 1 tablet (5 mg total) by mouth 2 (two) times daily.   fexofenadine 180 MG tablet Commonly known as: ALLEGRA Take 180 mg by mouth daily.   FLUoxetine 40 MG capsule Commonly known as: PROZAC Take 40 mg by mouth daily.   Januvia 100 MG tablet Generic drug: sitaGLIPtin Take 100 mg by mouth daily.   multivitamin-iron-minerals-folic acid chewable tablet Chew 1 tablet by mouth daily.   oxyCODONE 5 MG immediate release tablet Commonly known as: Oxy IR/ROXICODONE Take 1 tablet (5 mg total) by mouth every 6 (six) hours as needed for up to 5 days for moderate pain.   pregabalin 200 MG capsule Commonly known as: LYRICA Take 200 mg by mouth daily.   rosuvastatin 20 MG tablet Commonly known as: CRESTOR Take 1 tablet (20 mg total) by mouth daily.        Follow-up Information     Victorino Sparrow, MD Follow up in 4 week(s).   Specialty: Vascular Surgery Contact information: [address] [place] [address] [phone]         Olive Bass, MD. Schedule an appointment as soon as possible for a visit in 1 week(s).   Specialty: Family Medicine Contact information: [address] [place] [address] [phone]                Discharge Exam: Ceasar Mons Weights   12/17/22 1300  Weight: 129.9 kg   General exam: Appears calm and comfortable  Respiratory system: Clear to auscultation. Respiratory effort normal. Cardiovascular system: S1 & S2 heard, RRR.  Gastrointestinal system: Abdomen is nondistended, soft and nontender. Central nervous system: Alert and oriented.  Extremities: bilateral leg edema.  Skin: No rashes, lesions or ulcers Psychiatry: mood is appropriate.    Condition at discharge: fair  The results of significant diagnostics from this hospitalization (including imaging, microbiology,  ancillary and laboratory) are listed below for reference.   Imaging Studies: PERIPHERAL VASCULAR CATHETERIZATION  Result Date: 12/18/2022 Images from the original result were not included.   Patient name: Jaime Dean           MRN: [id number]        DOB: 03/22/1956          Sex: male  12/18/2022 Pre-operative Diagnosis: Left iliofemoral deep venous thrombosis Post-operative diagnosis: Left ilio caval, iliofemoral deep venous thrombosis Surgeon:  Victorino Sparrow, MD Procedure Performed: 1.  Ultrasound-guided micropuncture access of the right popliteal vein in retrograde fashion 2.  Venogram of the left lower extremity, iliac vein, inferior vena cava 3.  Intravascular ultrasound right popliteal vein, femoral vein, common femoral vein, external iliac vein, common iliac vein, inferior vena cava 4.  Percutaneous mechanical suction thrombectomy using the 16 French penumbra system of  875-125 MG tablet Commonly known as: AUGMENTIN   metFORMIN 1000 MG (MOD) 24 hr tablet Commonly known as: GLUMETZA       TAKE these medications    albuterol 108 (90 Base) MCG/ACT inhaler Commonly known as: VENTOLIN HFA Inhale 3 puffs into the lungs as needed for wheezing or shortness of breath.   aspirin EC 81 MG tablet Take 1 tablet (81 mg total) by mouth daily. Swallow whole.   busPIRone 10 MG tablet Commonly known as: BUSPAR Take 10-40 mg by mouth as needed (anxiety).   divalproex 500 MG 24 hr  tablet Commonly known as: DEPAKOTE ER Take 500 mg by mouth in the morning, at noon, and at bedtime.   Eliquis 5 MG Tabs tablet Generic drug: apixaban Take 1 tablet (5 mg total) by mouth 2 (two) times daily.   fexofenadine 180 MG tablet Commonly known as: ALLEGRA Take 180 mg by mouth daily.   FLUoxetine 40 MG capsule Commonly known as: PROZAC Take 40 mg by mouth daily.   Januvia 100 MG tablet Generic drug: sitaGLIPtin Take 100 mg by mouth daily.   multivitamin-iron-minerals-folic acid chewable tablet Chew 1 tablet by mouth daily.   oxyCODONE 5 MG immediate release tablet Commonly known as: Oxy IR/ROXICODONE Take 1 tablet (5 mg total) by mouth every 6 (six) hours as needed for up to 5 days for moderate pain.   pregabalin 200 MG capsule Commonly known as: LYRICA Take 200 mg by mouth daily.   rosuvastatin 20 MG tablet Commonly known as: CRESTOR Take 1 tablet (20 mg total) by mouth daily.        Follow-up Information     Victorino Sparrow, MD Follow up in 4 week(s).   Specialty: Vascular Surgery Contact information: [address] [place] [address] [phone]         Olive Bass, MD. Schedule an appointment as soon as possible for a visit in 1 week(s).   Specialty: Family Medicine Contact information: [address] [place] [address] [phone]                Discharge Exam: Ceasar Mons Weights   12/17/22 1300  Weight: 129.9 kg   General exam: Appears calm and comfortable  Respiratory system: Clear to auscultation. Respiratory effort normal. Cardiovascular system: S1 & S2 heard, RRR.  Gastrointestinal system: Abdomen is nondistended, soft and nontender. Central nervous system: Alert and oriented.  Extremities: bilateral leg edema.  Skin: No rashes, lesions or ulcers Psychiatry: mood is appropriate.    Condition at discharge: fair  The results of significant diagnostics from this hospitalization (including imaging, microbiology,  ancillary and laboratory) are listed below for reference.   Imaging Studies: PERIPHERAL VASCULAR CATHETERIZATION  Result Date: 12/18/2022 Images from the original result were not included.   Patient name: Jaime Dean           MRN: [id number]        DOB: 03/22/1956          Sex: male  12/18/2022 Pre-operative Diagnosis: Left iliofemoral deep venous thrombosis Post-operative diagnosis: Left ilio caval, iliofemoral deep venous thrombosis Surgeon:  Victorino Sparrow, MD Procedure Performed: 1.  Ultrasound-guided micropuncture access of the right popliteal vein in retrograde fashion 2.  Venogram of the left lower extremity, iliac vein, inferior vena cava 3.  Intravascular ultrasound right popliteal vein, femoral vein, common femoral vein, external iliac vein, common iliac vein, inferior vena cava 4.  Percutaneous mechanical suction thrombectomy using the 16 French penumbra system of  875-125 MG tablet Commonly known as: AUGMENTIN   metFORMIN 1000 MG (MOD) 24 hr tablet Commonly known as: GLUMETZA       TAKE these medications    albuterol 108 (90 Base) MCG/ACT inhaler Commonly known as: VENTOLIN HFA Inhale 3 puffs into the lungs as needed for wheezing or shortness of breath.   aspirin EC 81 MG tablet Take 1 tablet (81 mg total) by mouth daily. Swallow whole.   busPIRone 10 MG tablet Commonly known as: BUSPAR Take 10-40 mg by mouth as needed (anxiety).   divalproex 500 MG 24 hr  tablet Commonly known as: DEPAKOTE ER Take 500 mg by mouth in the morning, at noon, and at bedtime.   Eliquis 5 MG Tabs tablet Generic drug: apixaban Take 1 tablet (5 mg total) by mouth 2 (two) times daily.   fexofenadine 180 MG tablet Commonly known as: ALLEGRA Take 180 mg by mouth daily.   FLUoxetine 40 MG capsule Commonly known as: PROZAC Take 40 mg by mouth daily.   Januvia 100 MG tablet Generic drug: sitaGLIPtin Take 100 mg by mouth daily.   multivitamin-iron-minerals-folic acid chewable tablet Chew 1 tablet by mouth daily.   oxyCODONE 5 MG immediate release tablet Commonly known as: Oxy IR/ROXICODONE Take 1 tablet (5 mg total) by mouth every 6 (six) hours as needed for up to 5 days for moderate pain.   pregabalin 200 MG capsule Commonly known as: LYRICA Take 200 mg by mouth daily.   rosuvastatin 20 MG tablet Commonly known as: CRESTOR Take 1 tablet (20 mg total) by mouth daily.        Follow-up Information     Victorino Sparrow, MD Follow up in 4 week(s).   Specialty: Vascular Surgery Contact information: [address] [place] [address] [phone]         Olive Bass, MD. Schedule an appointment as soon as possible for a visit in 1 week(s).   Specialty: Family Medicine Contact information: [address] [place] [address] [phone]                Discharge Exam: Ceasar Mons Weights   12/17/22 1300  Weight: 129.9 kg   General exam: Appears calm and comfortable  Respiratory system: Clear to auscultation. Respiratory effort normal. Cardiovascular system: S1 & S2 heard, RRR.  Gastrointestinal system: Abdomen is nondistended, soft and nontender. Central nervous system: Alert and oriented.  Extremities: bilateral leg edema.  Skin: No rashes, lesions or ulcers Psychiatry: mood is appropriate.    Condition at discharge: fair  The results of significant diagnostics from this hospitalization (including imaging, microbiology,  ancillary and laboratory) are listed below for reference.   Imaging Studies: PERIPHERAL VASCULAR CATHETERIZATION  Result Date: 12/18/2022 Images from the original result were not included.   Patient name: Jaime Dean           MRN: [id number]        DOB: 03/22/1956          Sex: male  12/18/2022 Pre-operative Diagnosis: Left iliofemoral deep venous thrombosis Post-operative diagnosis: Left ilio caval, iliofemoral deep venous thrombosis Surgeon:  Victorino Sparrow, MD Procedure Performed: 1.  Ultrasound-guided micropuncture access of the right popliteal vein in retrograde fashion 2.  Venogram of the left lower extremity, iliac vein, inferior vena cava 3.  Intravascular ultrasound right popliteal vein, femoral vein, common femoral vein, external iliac vein, common iliac vein, inferior vena cava 4.  Percutaneous mechanical suction thrombectomy using the 16 French penumbra system of  the popliteal vein, femoral vein, common femoral vein, external iliac vein, common iliac vein, inferior vena cava 5.  Moderate sedation time 52 minutes, contrast volume 125 mL 6.  Venotomy managed with Monocryl suture.   Indications: Patient is a 67 year old male with prior history of left lower extremity DVT presumed to be provoked status post surgery with subsequent IVC filter placement in 2011.  He presented to the ED with new onset right lower extremity swelling with ultrasound demonstrating DVT extending from the external iliac vein through the tibial vessels.  He is very symptomatic, unable to walk.  After discussing the risks and benefits of mechanical venous thrombectomy in an effort to decrease clot burden and improve symptoms, the patient elected to proceed.  Findings: Occlusive thrombus in the right popliteal vein, femoral vein, common femoral vein, external iliac vein, common iliac vein, with partial occlusion in the inferior vena cava.  Thrombus to the level of the inferior vena cava filter.  Thrombus also appreciated in the  left common iliac vein, with a flow channel present.             Procedure:  The patient was identified in the holding area and taken to room 8.  The patient was then placed prone on the table.  He was prepped and draped in standard fashion a timeout was called.  An ultrasound was used to evaluate the right popliteal vein.  This was accessed in antegrade fashion, and a wire was brought into the superior vena cava.  Next, right lower extremity venography followed see results above.  I elected to attempt intervention on the above.  The patient was heparinized, and a 16 French dry seal sheath was placed in the right popliteal vein.  Next, intravascular ultrasound was used to evaluate the entirety of the left leg into the inferior vena cava as venography proved difficult due to the level of thrombus.  Next, the 16 French penumbra catheter was brought onto the field.  Multiple passes were made extending from the right popliteal vein to the inferior vena cava filter.  A significant amount of thrombus was removed.  Final venography demonstrated significant improvement with significant debulking of thrombus throughout the left venous system including the left iliac and inferior vena cava.  The filter was declotted as well. This was confirmed with use of intravascular ultrasound.  There was no compression requiring venoplasty.  Wires and sheaths were removed and the venotomy was managed with a Monocryl suture at the level of the skin.   Impression: Successful recanalization of the right popliteal vein, femoral vein, common femoral vein, external iliac vein, common iliac vein, inferior vena cava.  Residual thrombus appreciated in the left common iliac vein, however the patient was asymptomatic in the left leg.  My plan is to see him in 1 month's time with repeat venous duplex of the IVC, bilateral lower extremities.   Victorino Sparrow MD [facility] Office: [phone]   VAS [place] LOWER  EXTREMITY VENOUS (DVT)  Result Date: 12/17/2022  Lower Venous DVT Study Patient Name:  Jaime Dean  Date of Exam:   12/17/2022 Medical Rec #: [id number]       Accession #:    [id number] Date of Birth: May 30, 1955       Patient Gender: M Patient Age:   81 years Exam Location:  [facility] Procedure:      VAS [place] LOWER EXTREMITY VENOUS (DVT) Referring Phys: Cristal Deer DICKSON --------------------------------------------------------------------------------  Indications: Swelling,

## 2022-12-23 ENCOUNTER — Other Ambulatory Visit (HOSPITAL_COMMUNITY): Payer: Self-pay

## 2023-01-06 NOTE — Progress Notes (Unsigned)
9.2   Total Protein 6.5 - 8.1 g/dL 7.0   6.3   Total Bilirubin 0.3 - 1.2 mg/dL 0.7   0.7   Alkaline Phos 38 - 126 U/L 55   50   AST 15 - 41 U/L 21   22   ALT 0 - 44 U/L 28   40    STUDIES:  PERIPHERAL VASCULAR CATHETERIZATION  Result Date: 12/18/2022 Images from the original result were not included.   Patient name: Jaime Dean           MRN: [id number]        DOB: [birth year]          Sex: male  12/18/2022 Pre-operative Diagnosis: Left iliofemoral deep venous thrombosis Post-operative diagnosis: Left ilio caval, iliofemoral deep venous thrombosis Surgeon:  Victorino Sparrow, MD Procedure Performed: 1.  Ultrasound-guided micropuncture access of the right popliteal  vein in retrograde fashion 2.  Venogram of the left lower extremity, iliac vein, inferior vena cava 3.  Intravascular ultrasound right popliteal vein, femoral vein, common femoral vein, external iliac vein, common iliac vein, inferior vena cava 4.  Percutaneous mechanical suction thrombectomy using the 16 French penumbra system of the popliteal vein, femoral vein, common femoral vein, external iliac vein, common iliac vein, inferior vena cava 5.  Moderate sedation time 52 minutes, contrast volume 125 mL 6.  Venotomy managed with Monocryl suture.   Indications: Patient is a 67 year old male with prior history of left lower extremity DVT presumed to be provoked status post surgery with subsequent IVC filter placement in 2011.  He presented to the ED with new onset right lower extremity swelling with ultrasound demonstrating DVT extending from the external iliac vein through the tibial vessels.  He is very symptomatic, unable to walk.  After discussing the risks and benefits of mechanical venous thrombectomy in an effort to decrease clot burden and improve symptoms, the patient elected to proceed.  Findings: Occlusive thrombus in the right popliteal vein, femoral vein, common femoral vein, external iliac vein, common iliac vein, with partial occlusion in the inferior vena cava.  Thrombus to the level of the inferior vena cava filter.  Thrombus also appreciated in the left common iliac vein, with a flow channel present.             Procedure:  The patient was identified in the holding area and taken to room 8.  The patient was then placed prone on the table.  He was prepped and draped in standard fashion a timeout was called.  An ultrasound was used to evaluate the right popliteal vein.  This was accessed in antegrade fashion, and a wire was brought into the superior vena cava.  Next, right lower extremity venography followed see results above.  I elected to attempt intervention on the above.  The patient was  heparinized, and a 16 French dry seal sheath was placed in the right popliteal vein.  Next, intravascular ultrasound was used to evaluate the entirety of the left leg into the inferior vena cava as venography proved difficult due to the level of thrombus.  Next, the 16 French penumbra catheter was brought onto the field.  Multiple passes were made extending from the right popliteal vein to the inferior vena cava filter.  A significant amount of thrombus was removed.  Final venography demonstrated significant improvement with significant debulking of thrombus throughout the left venous system including the left iliac and inferior vena cava.  The filter was declotted as well. This  9.2   Total Protein 6.5 - 8.1 g/dL 7.0   6.3   Total Bilirubin 0.3 - 1.2 mg/dL 0.7   0.7   Alkaline Phos 38 - 126 U/L 55   50   AST 15 - 41 U/L 21   22   ALT 0 - 44 U/L 28   40    STUDIES:  PERIPHERAL VASCULAR CATHETERIZATION  Result Date: 12/18/2022 Images from the original result were not included.   Patient name: Jaime Dean           MRN: [id number]        DOB: [birth year]          Sex: male  12/18/2022 Pre-operative Diagnosis: Left iliofemoral deep venous thrombosis Post-operative diagnosis: Left ilio caval, iliofemoral deep venous thrombosis Surgeon:  Victorino Sparrow, MD Procedure Performed: 1.  Ultrasound-guided micropuncture access of the right popliteal  vein in retrograde fashion 2.  Venogram of the left lower extremity, iliac vein, inferior vena cava 3.  Intravascular ultrasound right popliteal vein, femoral vein, common femoral vein, external iliac vein, common iliac vein, inferior vena cava 4.  Percutaneous mechanical suction thrombectomy using the 16 French penumbra system of the popliteal vein, femoral vein, common femoral vein, external iliac vein, common iliac vein, inferior vena cava 5.  Moderate sedation time 52 minutes, contrast volume 125 mL 6.  Venotomy managed with Monocryl suture.   Indications: Patient is a 67 year old male with prior history of left lower extremity DVT presumed to be provoked status post surgery with subsequent IVC filter placement in 2011.  He presented to the ED with new onset right lower extremity swelling with ultrasound demonstrating DVT extending from the external iliac vein through the tibial vessels.  He is very symptomatic, unable to walk.  After discussing the risks and benefits of mechanical venous thrombectomy in an effort to decrease clot burden and improve symptoms, the patient elected to proceed.  Findings: Occlusive thrombus in the right popliteal vein, femoral vein, common femoral vein, external iliac vein, common iliac vein, with partial occlusion in the inferior vena cava.  Thrombus to the level of the inferior vena cava filter.  Thrombus also appreciated in the left common iliac vein, with a flow channel present.             Procedure:  The patient was identified in the holding area and taken to room 8.  The patient was then placed prone on the table.  He was prepped and draped in standard fashion a timeout was called.  An ultrasound was used to evaluate the right popliteal vein.  This was accessed in antegrade fashion, and a wire was brought into the superior vena cava.  Next, right lower extremity venography followed see results above.  I elected to attempt intervention on the above.  The patient was  heparinized, and a 16 French dry seal sheath was placed in the right popliteal vein.  Next, intravascular ultrasound was used to evaluate the entirety of the left leg into the inferior vena cava as venography proved difficult due to the level of thrombus.  Next, the 16 French penumbra catheter was brought onto the field.  Multiple passes were made extending from the right popliteal vein to the inferior vena cava filter.  A significant amount of thrombus was removed.  Final venography demonstrated significant improvement with significant debulking of thrombus throughout the left venous system including the left iliac and inferior vena cava.  The filter was declotted as well. This  was confirmed with use of intravascular ultrasound.  There was no compression requiring venoplasty.  Wires and sheaths were removed and the venotomy was managed with a Monocryl suture at the level of the skin.   Impression: Successful recanalization of the right popliteal vein, femoral vein, common femoral vein, external iliac vein, common iliac vein, inferior vena cava.  Residual thrombus appreciated in the left common iliac vein, however the patient was asymptomatic in the left leg.  My plan is to see him in 1 month's time with repeat venous duplex of the IVC, bilateral lower extremities.   Victorino Sparrow MD [facility] [place] Office: [phone]   VAS [place] LOWER EXTREMITY VENOUS (DVT)  Result Date: 12/17/2022  Lower Venous DVT Study Patient Name:  ODUS CLASBY  Date of Exam:   12/17/2022 Medical Rec #: [id number]       Accession #:    [id number] Date of Birth: [birth year]       Patient Gender: M Patient Age:   39 years Exam Location:  [facility] Procedure:      VAS [place] LOWER EXTREMITY VENOUS (DVT) Referring Phys: Cristal Deer DICKSON --------------------------------------------------------------------------------  Indications: Swelling, and Pain.  Risk Factors: DVT Remote history of DVT. Limitations: Body  habitus, poor ultrasound/tissue interface and patient pain tolerance. Comparison Study: No prior studies. Performing Technologist: Chanda Busing RVT  Examination Guidelines: A complete evaluation includes B-mode imaging, spectral Doppler, color Doppler, and power Doppler as needed of all accessible portions of each vessel. Bilateral testing is considered an integral part of a complete examination. Limited examinations for reoccurring indications may be performed as noted. The reflux portion of the exam is performed with the patient in reverse Trendelenburg.  +---------+---------------+---------+-----------+----------+--------------+ RIGHT    CompressibilityPhasicitySpontaneityPropertiesThrombus Aging +---------+---------------+---------+-----------+----------+--------------+ CFV      None           No       No                   Acute          +---------+---------------+---------+-----------+----------+--------------+ FV Prox  None           No       No                   Acute          +---------+---------------+---------+-----------+----------+--------------+ FV Mid   None           No       No                   Acute          +---------+---------------+---------+-----------+----------+--------------+ FV DistalNone           No       No                   Acute          +---------+---------------+---------+-----------+----------+--------------+ PFV      None           No       No                   Acute          +---------+---------------+---------+-----------+----------+--------------+ POP      None           No       No  9.2   Total Protein 6.5 - 8.1 g/dL 7.0   6.3   Total Bilirubin 0.3 - 1.2 mg/dL 0.7   0.7   Alkaline Phos 38 - 126 U/L 55   50   AST 15 - 41 U/L 21   22   ALT 0 - 44 U/L 28   40    STUDIES:  PERIPHERAL VASCULAR CATHETERIZATION  Result Date: 12/18/2022 Images from the original result were not included.   Patient name: Jaime Dean           MRN: [id number]        DOB: [birth year]          Sex: male  12/18/2022 Pre-operative Diagnosis: Left iliofemoral deep venous thrombosis Post-operative diagnosis: Left ilio caval, iliofemoral deep venous thrombosis Surgeon:  Victorino Sparrow, MD Procedure Performed: 1.  Ultrasound-guided micropuncture access of the right popliteal  vein in retrograde fashion 2.  Venogram of the left lower extremity, iliac vein, inferior vena cava 3.  Intravascular ultrasound right popliteal vein, femoral vein, common femoral vein, external iliac vein, common iliac vein, inferior vena cava 4.  Percutaneous mechanical suction thrombectomy using the 16 French penumbra system of the popliteal vein, femoral vein, common femoral vein, external iliac vein, common iliac vein, inferior vena cava 5.  Moderate sedation time 52 minutes, contrast volume 125 mL 6.  Venotomy managed with Monocryl suture.   Indications: Patient is a 67 year old male with prior history of left lower extremity DVT presumed to be provoked status post surgery with subsequent IVC filter placement in 2011.  He presented to the ED with new onset right lower extremity swelling with ultrasound demonstrating DVT extending from the external iliac vein through the tibial vessels.  He is very symptomatic, unable to walk.  After discussing the risks and benefits of mechanical venous thrombectomy in an effort to decrease clot burden and improve symptoms, the patient elected to proceed.  Findings: Occlusive thrombus in the right popliteal vein, femoral vein, common femoral vein, external iliac vein, common iliac vein, with partial occlusion in the inferior vena cava.  Thrombus to the level of the inferior vena cava filter.  Thrombus also appreciated in the left common iliac vein, with a flow channel present.             Procedure:  The patient was identified in the holding area and taken to room 8.  The patient was then placed prone on the table.  He was prepped and draped in standard fashion a timeout was called.  An ultrasound was used to evaluate the right popliteal vein.  This was accessed in antegrade fashion, and a wire was brought into the superior vena cava.  Next, right lower extremity venography followed see results above.  I elected to attempt intervention on the above.  The patient was  heparinized, and a 16 French dry seal sheath was placed in the right popliteal vein.  Next, intravascular ultrasound was used to evaluate the entirety of the left leg into the inferior vena cava as venography proved difficult due to the level of thrombus.  Next, the 16 French penumbra catheter was brought onto the field.  Multiple passes were made extending from the right popliteal vein to the inferior vena cava filter.  A significant amount of thrombus was removed.  Final venography demonstrated significant improvement with significant debulking of thrombus throughout the left venous system including the left iliac and inferior vena cava.  The filter was declotted as well. This  9.2   Total Protein 6.5 - 8.1 g/dL 7.0   6.3   Total Bilirubin 0.3 - 1.2 mg/dL 0.7   0.7   Alkaline Phos 38 - 126 U/L 55   50   AST 15 - 41 U/L 21   22   ALT 0 - 44 U/L 28   40    STUDIES:  PERIPHERAL VASCULAR CATHETERIZATION  Result Date: 12/18/2022 Images from the original result were not included.   Patient name: Jaime Dean           MRN: [id number]        DOB: [birth year]          Sex: male  12/18/2022 Pre-operative Diagnosis: Left iliofemoral deep venous thrombosis Post-operative diagnosis: Left ilio caval, iliofemoral deep venous thrombosis Surgeon:  Victorino Sparrow, MD Procedure Performed: 1.  Ultrasound-guided micropuncture access of the right popliteal  vein in retrograde fashion 2.  Venogram of the left lower extremity, iliac vein, inferior vena cava 3.  Intravascular ultrasound right popliteal vein, femoral vein, common femoral vein, external iliac vein, common iliac vein, inferior vena cava 4.  Percutaneous mechanical suction thrombectomy using the 16 French penumbra system of the popliteal vein, femoral vein, common femoral vein, external iliac vein, common iliac vein, inferior vena cava 5.  Moderate sedation time 52 minutes, contrast volume 125 mL 6.  Venotomy managed with Monocryl suture.   Indications: Patient is a 67 year old male with prior history of left lower extremity DVT presumed to be provoked status post surgery with subsequent IVC filter placement in 2011.  He presented to the ED with new onset right lower extremity swelling with ultrasound demonstrating DVT extending from the external iliac vein through the tibial vessels.  He is very symptomatic, unable to walk.  After discussing the risks and benefits of mechanical venous thrombectomy in an effort to decrease clot burden and improve symptoms, the patient elected to proceed.  Findings: Occlusive thrombus in the right popliteal vein, femoral vein, common femoral vein, external iliac vein, common iliac vein, with partial occlusion in the inferior vena cava.  Thrombus to the level of the inferior vena cava filter.  Thrombus also appreciated in the left common iliac vein, with a flow channel present.             Procedure:  The patient was identified in the holding area and taken to room 8.  The patient was then placed prone on the table.  He was prepped and draped in standard fashion a timeout was called.  An ultrasound was used to evaluate the right popliteal vein.  This was accessed in antegrade fashion, and a wire was brought into the superior vena cava.  Next, right lower extremity venography followed see results above.  I elected to attempt intervention on the above.  The patient was  heparinized, and a 16 French dry seal sheath was placed in the right popliteal vein.  Next, intravascular ultrasound was used to evaluate the entirety of the left leg into the inferior vena cava as venography proved difficult due to the level of thrombus.  Next, the 16 French penumbra catheter was brought onto the field.  Multiple passes were made extending from the right popliteal vein to the inferior vena cava filter.  A significant amount of thrombus was removed.  Final venography demonstrated significant improvement with significant debulking of thrombus throughout the left venous system including the left iliac and inferior vena cava.  The filter was declotted as well. This  9.2   Total Protein 6.5 - 8.1 g/dL 7.0   6.3   Total Bilirubin 0.3 - 1.2 mg/dL 0.7   0.7   Alkaline Phos 38 - 126 U/L 55   50   AST 15 - 41 U/L 21   22   ALT 0 - 44 U/L 28   40    STUDIES:  PERIPHERAL VASCULAR CATHETERIZATION  Result Date: 12/18/2022 Images from the original result were not included.   Patient name: Jaime Dean           MRN: [id number]        DOB: [birth year]          Sex: male  12/18/2022 Pre-operative Diagnosis: Left iliofemoral deep venous thrombosis Post-operative diagnosis: Left ilio caval, iliofemoral deep venous thrombosis Surgeon:  Victorino Sparrow, MD Procedure Performed: 1.  Ultrasound-guided micropuncture access of the right popliteal  vein in retrograde fashion 2.  Venogram of the left lower extremity, iliac vein, inferior vena cava 3.  Intravascular ultrasound right popliteal vein, femoral vein, common femoral vein, external iliac vein, common iliac vein, inferior vena cava 4.  Percutaneous mechanical suction thrombectomy using the 16 French penumbra system of the popliteal vein, femoral vein, common femoral vein, external iliac vein, common iliac vein, inferior vena cava 5.  Moderate sedation time 52 minutes, contrast volume 125 mL 6.  Venotomy managed with Monocryl suture.   Indications: Patient is a 67 year old male with prior history of left lower extremity DVT presumed to be provoked status post surgery with subsequent IVC filter placement in 2011.  He presented to the ED with new onset right lower extremity swelling with ultrasound demonstrating DVT extending from the external iliac vein through the tibial vessels.  He is very symptomatic, unable to walk.  After discussing the risks and benefits of mechanical venous thrombectomy in an effort to decrease clot burden and improve symptoms, the patient elected to proceed.  Findings: Occlusive thrombus in the right popliteal vein, femoral vein, common femoral vein, external iliac vein, common iliac vein, with partial occlusion in the inferior vena cava.  Thrombus to the level of the inferior vena cava filter.  Thrombus also appreciated in the left common iliac vein, with a flow channel present.             Procedure:  The patient was identified in the holding area and taken to room 8.  The patient was then placed prone on the table.  He was prepped and draped in standard fashion a timeout was called.  An ultrasound was used to evaluate the right popliteal vein.  This was accessed in antegrade fashion, and a wire was brought into the superior vena cava.  Next, right lower extremity venography followed see results above.  I elected to attempt intervention on the above.  The patient was  heparinized, and a 16 French dry seal sheath was placed in the right popliteal vein.  Next, intravascular ultrasound was used to evaluate the entirety of the left leg into the inferior vena cava as venography proved difficult due to the level of thrombus.  Next, the 16 French penumbra catheter was brought onto the field.  Multiple passes were made extending from the right popliteal vein to the inferior vena cava filter.  A significant amount of thrombus was removed.  Final venography demonstrated significant improvement with significant debulking of thrombus throughout the left venous system including the left iliac and inferior vena cava.  The filter was declotted as well. This  was confirmed with use of intravascular ultrasound.  There was no compression requiring venoplasty.  Wires and sheaths were removed and the venotomy was managed with a Monocryl suture at the level of the skin.   Impression: Successful recanalization of the right popliteal vein, femoral vein, common femoral vein, external iliac vein, common iliac vein, inferior vena cava.  Residual thrombus appreciated in the left common iliac vein, however the patient was asymptomatic in the left leg.  My plan is to see him in 1 month's time with repeat venous duplex of the IVC, bilateral lower extremities.   Victorino Sparrow MD [facility] [place] Office: [phone]   VAS [place] LOWER EXTREMITY VENOUS (DVT)  Result Date: 12/17/2022  Lower Venous DVT Study Patient Name:  ODUS CLASBY  Date of Exam:   12/17/2022 Medical Rec #: [id number]       Accession #:    [id number] Date of Birth: [birth year]       Patient Gender: M Patient Age:   39 years Exam Location:  [facility] Procedure:      VAS [place] LOWER EXTREMITY VENOUS (DVT) Referring Phys: Cristal Deer DICKSON --------------------------------------------------------------------------------  Indications: Swelling, and Pain.  Risk Factors: DVT Remote history of DVT. Limitations: Body  habitus, poor ultrasound/tissue interface and patient pain tolerance. Comparison Study: No prior studies. Performing Technologist: Chanda Busing RVT  Examination Guidelines: A complete evaluation includes B-mode imaging, spectral Doppler, color Doppler, and power Doppler as needed of all accessible portions of each vessel. Bilateral testing is considered an integral part of a complete examination. Limited examinations for reoccurring indications may be performed as noted. The reflux portion of the exam is performed with the patient in reverse Trendelenburg.  +---------+---------------+---------+-----------+----------+--------------+ RIGHT    CompressibilityPhasicitySpontaneityPropertiesThrombus Aging +---------+---------------+---------+-----------+----------+--------------+ CFV      None           No       No                   Acute          +---------+---------------+---------+-----------+----------+--------------+ FV Prox  None           No       No                   Acute          +---------+---------------+---------+-----------+----------+--------------+ FV Mid   None           No       No                   Acute          +---------+---------------+---------+-----------+----------+--------------+ FV DistalNone           No       No                   Acute          +---------+---------------+---------+-----------+----------+--------------+ PFV      None           No       No                   Acute          +---------+---------------+---------+-----------+----------+--------------+ POP      None           No       No

## 2023-01-07 ENCOUNTER — Inpatient Hospital Stay: Payer: Medicare Other | Attending: Oncology | Admitting: Oncology

## 2023-01-07 ENCOUNTER — Other Ambulatory Visit: Payer: Self-pay | Admitting: Oncology

## 2023-01-07 ENCOUNTER — Inpatient Hospital Stay: Payer: Medicare Other

## 2023-01-07 ENCOUNTER — Encounter: Payer: Self-pay | Admitting: Oncology

## 2023-01-07 VITALS — BP 127/59 | HR 82 | Temp 98.0°F | Resp 16 | Ht 65.0 in | Wt 268.4 lb

## 2023-01-07 DIAGNOSIS — R768 Other specified abnormal immunological findings in serum: Secondary | ICD-10-CM | POA: Diagnosis not present

## 2023-01-07 DIAGNOSIS — C189 Malignant neoplasm of colon, unspecified: Secondary | ICD-10-CM

## 2023-01-07 DIAGNOSIS — D649 Anemia, unspecified: Secondary | ICD-10-CM

## 2023-01-07 DIAGNOSIS — I82491 Acute embolism and thrombosis of other specified deep vein of right lower extremity: Secondary | ICD-10-CM

## 2023-01-07 DIAGNOSIS — Z6841 Body Mass Index (BMI) 40.0 and over, adult: Secondary | ICD-10-CM | POA: Insufficient documentation

## 2023-01-07 DIAGNOSIS — Z7901 Long term (current) use of anticoagulants: Secondary | ICD-10-CM | POA: Diagnosis not present

## 2023-01-07 DIAGNOSIS — C61 Malignant neoplasm of prostate: Secondary | ICD-10-CM

## 2023-01-07 DIAGNOSIS — I82413 Acute embolism and thrombosis of femoral vein, bilateral: Secondary | ICD-10-CM

## 2023-01-07 DIAGNOSIS — I82403 Acute embolism and thrombosis of unspecified deep veins of lower extremity, bilateral: Secondary | ICD-10-CM | POA: Diagnosis present

## 2023-01-07 DIAGNOSIS — Z800 Family history of malignant neoplasm of digestive organs: Secondary | ICD-10-CM

## 2023-01-07 DIAGNOSIS — I82412 Acute embolism and thrombosis of left femoral vein: Secondary | ICD-10-CM | POA: Diagnosis not present

## 2023-01-07 DIAGNOSIS — E669 Obesity, unspecified: Secondary | ICD-10-CM | POA: Insufficient documentation

## 2023-01-07 DIAGNOSIS — R7989 Other specified abnormal findings of blood chemistry: Secondary | ICD-10-CM | POA: Insufficient documentation

## 2023-01-07 DIAGNOSIS — Z87891 Personal history of nicotine dependence: Secondary | ICD-10-CM

## 2023-01-07 DIAGNOSIS — D630 Anemia in neoplastic disease: Secondary | ICD-10-CM

## 2023-01-07 LAB — CBC WITH DIFFERENTIAL (CANCER CENTER ONLY)
Abs Immature Granulocytes: 0.21 10*3/uL — ABNORMAL HIGH (ref 0.00–0.07)
Basophils Absolute: 0.1 10*3/uL (ref 0.0–0.1)
Basophils Relative: 1 %
Eosinophils Absolute: 0.6 10*3/uL — ABNORMAL HIGH (ref 0.0–0.5)
Eosinophils Relative: 5 %
HCT: 42.5 % (ref 39.0–52.0)
Hemoglobin: 13.1 g/dL (ref 13.0–17.0)
Immature Granulocytes: 2 %
Lymphocytes Relative: 16 %
Lymphs Abs: 1.8 10*3/uL (ref 0.7–4.0)
MCH: 26 pg (ref 26.0–34.0)
MCHC: 30.8 g/dL (ref 30.0–36.0)
MCV: 84.5 fL (ref 80.0–100.0)
Monocytes Absolute: 0.9 10*3/uL (ref 0.1–1.0)
Monocytes Relative: 8 %
Neutro Abs: 7.6 10*3/uL (ref 1.7–7.7)
Neutrophils Relative %: 68 %
Platelet Count: 323 10*3/uL (ref 150–400)
RBC: 5.03 MIL/uL (ref 4.22–5.81)
RDW: 15.6 % — ABNORMAL HIGH (ref 11.5–15.5)
WBC Count: 11.2 10*3/uL — ABNORMAL HIGH (ref 4.0–10.5)
nRBC: 0 % (ref 0.0–0.2)

## 2023-01-07 LAB — IRON AND TIBC
Iron: 51 ug/dL (ref 45–182)
Saturation Ratios: 14 % — ABNORMAL LOW (ref 17.9–39.5)
TIBC: 375 ug/dL (ref 250–450)
UIBC: 324 ug/dL

## 2023-01-07 LAB — CMP (CANCER CENTER ONLY)
ALT: 28 U/L (ref 0–44)
AST: 21 U/L (ref 15–41)
Albumin: 3.7 g/dL (ref 3.5–5.0)
Alkaline Phosphatase: 55 U/L (ref 38–126)
Anion gap: 9 (ref 5–15)
BUN: 17 mg/dL (ref 8–23)
CO2: 26 mmol/L (ref 22–32)
Calcium: 9.1 mg/dL (ref 8.9–10.3)
Chloride: 103 mmol/L (ref 98–111)
Creatinine: 0.96 mg/dL (ref 0.61–1.24)
GFR, Estimated: >60 mL/min (ref >60)
Glucose, Bld: 151 mg/dL — ABNORMAL HIGH (ref 70–99)
Potassium: 3.9 mmol/L (ref 3.5–5.1)
Sodium: 138 mmol/L (ref 135–145)
Total Bilirubin: 0.7 mg/dL (ref 0.3–1.2)
Total Protein: 7 g/dL (ref 6.5–8.1)

## 2023-01-07 LAB — VITAMIN B12: Vitamin B-12: 236 pg/mL (ref 180–914)

## 2023-01-07 LAB — FERRITIN: Ferritin: 73 ng/mL (ref 24–336)

## 2023-01-07 LAB — PSA: Prostatic Specific Antigen: 3.52 ng/mL (ref 0.00–4.00)

## 2023-01-07 LAB — FOLATE: Folate: 17.3 ng/mL (ref >5.9)

## 2023-01-08 ENCOUNTER — Other Ambulatory Visit: Payer: Self-pay | Admitting: *Deleted

## 2023-01-08 DIAGNOSIS — L03116 Cellulitis of left lower limb: Secondary | ICD-10-CM

## 2023-01-08 LAB — BETA-2-GLYCOPROTEIN I ABS, IGG/M/A
Beta-2 Glyco I IgG: <9 GPI IgG units (ref 0–20)
Beta-2-Glycoprotein I IgA: <9 GPI IgA units (ref 0–25)
Beta-2-Glycoprotein I IgM: 69 GPI IgM units — ABNORMAL HIGH (ref 0–32)

## 2023-01-08 LAB — LUPUS ANTICOAGULANT
DRVVT: 81 s — ABNORMAL HIGH (ref 0.0–47.0)
PTT Lupus Anticoagulant: 41.2 s (ref 0.0–43.5)
Thrombin Time: 20.5 s (ref 0.0–23.0)
dPT Confirm Ratio: 1 {ratio} (ref 0.00–1.34)
dPT: 51.1 s — ABNORMAL HIGH (ref 0.0–47.6)

## 2023-01-08 LAB — DRVVT MIX: dRVVT Mix: 47.8 s — ABNORMAL HIGH (ref 0.0–40.4)

## 2023-01-08 LAB — ANTITHROMBIN PANEL
AT III AG PPP IMM-ACNC: 100 % (ref 72–124)
Antithrombin Activity: 133 % (ref 75–135)

## 2023-01-08 LAB — DRVVT CONFIRM: dRVVT Confirm: 1.3 {ratio} — ABNORMAL HIGH (ref 0.8–1.2)

## 2023-01-08 LAB — CEA: CEA: 3.8 ng/mL (ref 0.0–4.7)

## 2023-01-08 LAB — PROTEIN S, TOTAL AND FREE
Protein S Ag, Free: 142 % — ABNORMAL HIGH (ref 61–136)
Protein S Ag, Total: 131 % (ref 60–150)

## 2023-01-09 LAB — CARDIOLIPIN ANTIBODIES, IGG, IGM, IGA
Anticardiolipin IgA: <9 [APL'U]/mL (ref 0–11)
Anticardiolipin IgG: <9 [GPL'U]/mL (ref 0–14)
Anticardiolipin IgM: 46 [MPL'U]/mL — ABNORMAL HIGH (ref 0–12)

## 2023-01-13 LAB — PROTEIN C, TOTAL: Protein C, Total: 129 % (ref 60–150)

## 2023-01-13 LAB — PROTEIN C ACTIVITY: Protein C Activity: 121 % (ref 73–180)

## 2023-01-14 LAB — PROTHROMBIN GENE MUTATION

## 2023-01-14 LAB — FACTOR 5 LEIDEN

## 2023-01-20 ENCOUNTER — Ambulatory Visit (INDEPENDENT_AMBULATORY_CARE_PROVIDER_SITE_OTHER)
Admit: 2023-01-20 | Discharge: 2023-01-20 | Disposition: A | Discharge status: Home / Self Care | Payer: Medicare Other | Attending: Surgery | Admitting: Surgery

## 2023-01-20 ENCOUNTER — Ambulatory Visit (HOSPITAL_COMMUNITY)
Admission: RE | Admit: 2023-01-20 | Discharge: 2023-01-20 | Disposition: A | Discharge status: Home / Self Care | Payer: Medicare Other | Source: Ambulatory Visit | Attending: Surgery | Admitting: Surgery

## 2023-01-20 DIAGNOSIS — L03116 Cellulitis of left lower limb: Secondary | ICD-10-CM

## 2023-01-21 NOTE — Progress Notes (Unsigned)
Office Note    HPI: Jaime Dean is a 67 y.o. (03/14/1956) male presenting presenting status post percutaneous mechanical thrombectomy of left-sided DVT extending from the inferior vena cava filter tibial veins.  Partial occlusion was also appreciated in the right iliac system, however he was asymptomatic, therefore this was treated with anticoagulation.    The pt is *** on a statin for cholesterol management.  The pt is *** on a daily aspirin.   Other AC:  *** The pt is *** on medication for hypertension.   The pt is *** diabetic.  Tobacco hx:  ***  Past Medical History:  Diagnosis Date   Bipolar 1 disorder (HCC)    Diabetes mellitus type II    Glaucoma    Hearing loss    History of blood clots    Manic depression (HCC)    Plantar fasciitis    Pneumonia    Post traumatic stress disorder (PTSD)    Sleep apnea    Wears glasses     Past Surgical History:  Procedure Laterality Date   CATARACT EXTRACTION Bilateral    COLLATERAL LIGAMENT REPAIR, KNEE     CYSTOSCOPY     WITH STONE REMOVAL   EYE SURGERY Bilateral    LASER REPAIR OF GLAUCOMA   FOREIGN OBJECT REMOVED FROM RIGHT SCALP     IVC VENOGRAPHY N/A 12/18/2022   Procedure: IVC Venography;  Surgeon: Victorino Sparrow, MD;  Location: [facility] INVASIVE CV LAB;  Service: Cardiovascular;  Laterality: N/A;   KNEE ARTHROPLASTY Bilateral    KNEE ARTHROSCOPY     right knee   LAPROSCOPIC GASTRIC BANDING     LEFT EXTENSOR TENDON WRIST REPAIR     LOWER EXTREMITY VENOGRAPHY Right 12/18/2022   Procedure: LOWER EXTREMITY VENOGRAPHY;  Surgeon: Victorino Sparrow, MD;  Location: [facility] INVASIVE CV LAB;  Service: Cardiovascular;  Laterality: Right;   PERIPHERAL VASCULAR THROMBECTOMY N/A 12/18/2022   Procedure: PERIPHERAL VASCULAR THROMBECTOMY;  Surgeon: Victorino Sparrow, MD;  Location: [facility] INVASIVE CV LAB;  Service: Cardiovascular;  Laterality: N/A;   PERIPHERAL VASCULAR ULTRASOUND/IVUS Right 12/18/2022   Procedure: Peripheral Vascular  Ultrasound/IVUS;  Surgeon: Victorino Sparrow, MD;  Location: [facility] INVASIVE CV LAB;  Service: Cardiovascular;  Laterality: Right;   TONSILLECTOMY AND ADENOIDECTOMY     TRANSURETHRAL RESECTION OF PROSTATE     UMBILICAL HERNIA REPAIR      Social History   Socioeconomic History   Marital status: Married    Spouse name: MARTHA   Number of children: 0   Years of education: 12 + SOME COLLEGE   Highest education level: Not on file  Occupational History   Not on file  Tobacco Use   Smoking status: Former    Current packs/day: 0.00    Types: Cigarettes    Quit date: 2005    Years since quitting: 19.8   Smokeless tobacco: Never   Tobacco comments:    light smoker/ off and on  Vaping Use   Vaping status: Never Used  Substance and Sexual Activity   Alcohol use: No   Drug use: No   Sexual activity: Not Currently  Other Topics Concern   Not on file  Social History Narrative   Not on file   Social Determinants of Health   Financial Resource Strain: Not on file  Food Insecurity: Food Insecurity Present (01/07/2023)   Hunger Vital Sign    Worried About Running Out of Food in the Last Year: Sometimes true    Ran Out  of Food in the Last Year: Often true  Transportation Needs: No Transportation Needs (01/07/2023)   PRAPARE - Administrator, Civil Service (Medical): No    Lack of Transportation (Non-Medical): No  Physical Activity: Not on file  Stress: Not on file  Social Connections: Not on file  Intimate Partner Violence: Not At Risk (01/07/2023)   Humiliation, Afraid, Rape, and Kick questionnaire    Fear of Current or Ex-Partner: No    Emotionally Abused: No    Physically Abused: No    Sexually Abused: No   *** Family History  Problem Relation Age of Onset   Arthritis Mother    Heart disease Father    Heart attack Father    Arthritis Father    Throat cancer Paternal Grandfather     Current Outpatient Medications  Medication Sig Dispense Refill   albuterol  (VENTOLIN HFA) 108 (90 Base) MCG/ACT inhaler Inhale 3 puffs into the lungs as needed for wheezing or shortness of breath.     apixaban (ELIQUIS) 5 MG TABS tablet Take 1 tablet (5 mg total) by mouth 2 (two) times daily. 60 tablet 3   aspirin EC 81 MG tablet Take 1 tablet (81 mg total) by mouth daily. Swallow whole. 120 tablet 3   busPIRone (BUSPAR) 10 MG tablet Take 10-40 mg by mouth as needed (anxiety).     divalproex (DEPAKOTE ER) 500 MG 24 hr tablet Take 500 mg by mouth in the morning, at noon, and at bedtime.     fexofenadine (ALLEGRA) 180 MG tablet Take 180 mg by mouth daily.     FLUoxetine (PROZAC) 40 MG capsule Take 40 mg by mouth daily.     JANUVIA 100 MG tablet Take 100 mg by mouth daily.     multivitamin-iron-minerals-folic acid (CENTRUM) chewable tablet Chew 1 tablet by mouth daily.       pregabalin (LYRICA) 200 MG capsule Take 200 mg by mouth daily.     rosuvastatin (CRESTOR) 20 MG tablet Take 1 tablet (20 mg total) by mouth daily. 90 tablet 3   No current facility-administered medications for this visit.    Allergies  Allergen Reactions   Penicillins Anaphylaxis    Patient took augmentin as an outpatient    Sulfa Antibiotics Swelling   Tegaderm Ag Mesh [Silver] Other (See Comments)    Blisters, peeling skin    Avelox [Moxifloxacin Hcl In Nacl] Rash   Clindamycin Rash     REVIEW OF SYSTEMS:  *** [X]  denotes positive finding, [ ]  denotes negative finding Cardiac  Comments:  Chest pain or chest pressure:    Shortness of breath upon exertion:    Short of breath when lying flat:    Irregular heart rhythm:        Vascular    Pain in calf, thigh, or hip brought on by ambulation:    Pain in feet at night that wakes you up from your sleep:     Blood clot in your veins:    Leg swelling:         Pulmonary    Oxygen at home:    Productive cough:     Wheezing:         Neurologic    Sudden weakness in arms or legs:     Sudden numbness in arms or legs:     Sudden onset  of difficulty speaking or slurred speech:    Temporary loss of vision in one eye:     Problems with  dizziness:         Gastrointestinal    Blood in stool:     Vomited blood:         Genitourinary    Burning when urinating:     Blood in urine:        Psychiatric    Major depression:         Hematologic    Bleeding problems:    Problems with blood clotting too easily:        Skin    Rashes or ulcers:        Constitutional    Fever or chills:      PHYSICAL EXAMINATION:  There were no vitals filed for this visit.  General:  WDWN in NAD; vital signs documented above Gait: Not observed HENT: WNL, normocephalic Pulmonary: normal non-labored breathing , without wheezing Cardiac: {Desc; regular/irreg:14544} HR Abdomen: soft, NT, no masses Skin: {With/Without:20273} rashes Vascular Exam/Pulses:  Right Left  Radial {Exam; arterial pulse strength 0-4:30167} {Exam; arterial pulse strength 0-4:30167}  Ulnar {Exam; arterial pulse strength 0-4:30167} {Exam; arterial pulse strength 0-4:30167}  Femoral {Exam; arterial pulse strength 0-4:30167} {Exam; arterial pulse strength 0-4:30167}  Popliteal {Exam; arterial pulse strength 0-4:30167} {Exam; arterial pulse strength 0-4:30167}  DP {Exam; arterial pulse strength 0-4:30167} {Exam; arterial pulse strength 0-4:30167}  PT {Exam; arterial pulse strength 0-4:30167} {Exam; arterial pulse strength 0-4:30167}   Extremities: {With/Without:20273} ischemic changes, {With/Without:20273} Gangrene , {With/Without:20273} cellulitis; {With/Without:20273} open wounds;  Musculoskeletal: no muscle wasting or atrophy  Neurologic: A&O X 3;  No focal weakness or paresthesias are detected Psychiatric:  The pt has {Desc; normal/abnormal:11317::"Normal"} affect.   Non-Invasive Vascular Imaging:   ***    ASSESSMENT/PLAN: BHAVIN NIELSEN is a 67 y.o. male presenting status post percutaneous mechanical thrombectomy of left-sided DVT extending from the  inferior vena cava filter tibial veins.  Partial occlusion was also appreciated in the right iliac system, however he was asymptomatic, therefore this was treated with anticoagulation.  On physical exam, legs were symmetric in size Imaging was reviewed demonstrating no residual thrombus within his venous system.  He is DVT-free. I am very happy with his result. He would benefit from lifelong anticoagulation as the event is deemed unprovoked. He would also benefit from ensuring all precancer screenings are up-to-date.  We discussed IVC filter retrieval.  The filter has been in place for over 10 years which significantly decreases the likelihood of successful retrieval.  I think if he continues on anticoagulation ***   Victorino Sparrow, MD [facility] [phone]

## 2023-01-23 ENCOUNTER — Encounter: Payer: Self-pay | Admitting: Vascular Surgery

## 2023-01-23 ENCOUNTER — Ambulatory Visit: Payer: Medicare Other | Admitting: Vascular Surgery

## 2023-01-23 ENCOUNTER — Ambulatory Visit: Payer: Medicare Other | Admitting: Oncology

## 2023-01-23 VITALS — BP 112/79 | HR 80 | Temp 98.1°F | Resp 20 | Ht 65.0 in | Wt 274.0 lb

## 2023-01-23 DIAGNOSIS — I82422 Acute embolism and thrombosis of left iliac vein: Secondary | ICD-10-CM | POA: Diagnosis not present

## 2023-01-23 NOTE — Progress Notes (Unsigned)
[facility]  [address] [place],  [place]  [address] [phone]  Clinic Day:  01/24/2023  Referring physician: Olive Bass, MD   HISTORY OF PRESENT ILLNESS:  The patient is a 67 y.o. male  who I recently began seeing for having bilateral lower extremity DVTs.  He comes in today to go over the results of his hypercoagulable workup.  Since his last visit, the patient has been doing fairly well.  He denies having any further issues with blood clots.  He continues to take Eliquis 5 mg twice daily for his anticoagulation management.  PHYSICAL EXAM:  Blood pressure (!) 141/77, pulse 76, temperature 97.9 F (36.6 C), resp. rate 16, height 5\' 5"  (1.651 m), weight 274 lb (124.3 kg), SpO2 96%. Wt Readings from Last 3 Encounters:  01/24/23 274 lb (124.3 kg)  01/23/23 274 lb (124.3 kg)  01/07/23 268 lb 6.4 oz (121.7 kg)   Body mass index is 45.6 kg/m. Performance status (ECOG): 1 - Symptomatic but completely ambulatory Physical Exam Constitutional:      Appearance: Normal appearance. He is obese. He is not ill-appearing.  HENT:     Mouth/Throat:     Mouth: Mucous membranes are moist.     Pharynx: Oropharynx is clear. No oropharyngeal exudate or posterior oropharyngeal erythema.  Cardiovascular:     Rate and Rhythm: Normal rate and regular rhythm.     Heart sounds: No murmur heard.    No friction rub. No gallop.  Pulmonary:     Effort: Pulmonary effort is normal. No respiratory distress.     Breath sounds: Normal breath sounds. No wheezing, rhonchi or rales.  Abdominal:     General: Bowel sounds are normal. There is no distension.     Palpations: Abdomen is soft. There is no mass.     Tenderness: There is no abdominal tenderness.  Musculoskeletal:        General: No swelling.     Right upper leg: Edema present. No swelling.     Left upper leg: Edema present. No swelling.     Right lower leg: No edema.     Left lower leg: No edema.      Comments: TED hose present, with no visible swelling in his lower legs  Lymphadenopathy:     Cervical: No cervical adenopathy.     Upper Body:     Right upper body: No supraclavicular or axillary adenopathy.     Left upper body: No supraclavicular or axillary adenopathy.     Lower Body: No right inguinal adenopathy. No left inguinal adenopathy.  Skin:    General: Skin is warm.     Coloration: Skin is not jaundiced.     Findings: No lesion or rash.  Neurological:     General: No focal deficit present.     Mental Status: He is alert and oriented to person, place, and time. Mental status is at baseline.  Psychiatric:        Mood and Affect: Mood normal.        Behavior: Behavior normal.        Thought Content: Thought content normal.   LABS:      Latest Ref Rng & Units 01/07/2023   11:33 AM 12/19/2022    9:43 AM 12/18/2022    9:25 PM  CBC  WBC 4.0 - 10.5 K/uL 11.2  10.3    Hemoglobin 13.0 - 17.0 g/dL 91.4  78.2  95.6   Hematocrit 39.0 -  52.0 % 42.5  37.6  40.8   Platelets 150 - 400 K/uL 323  216        Latest Ref Rng & Units 01/07/2023   11:33 AM 12/18/2022    4:50 AM 03/20/2010    7:20 AM  CMP  Glucose 70 - 99 mg/dL 409  811  914   BUN 8 - 23 mg/dL 17  18  12    Creatinine 0.61 - 1.24 mg/dL 7.82  9.56  2.13   Sodium 135 - 145 mmol/L 138  135  137   Potassium 3.5 - 5.1 mmol/L 3.9  4.4  3.8   Chloride 98 - 111 mmol/L 103  99  101   CO2 22 - 32 mmol/L 26  27  29    Calcium 8.9 - 10.3 mg/dL 9.1  9.4  9.2   Total Protein 6.5 - 8.1 g/dL 7.0   6.3   Total Bilirubin 0.3 - 1.2 mg/dL 0.7   0.7   Alkaline Phos 38 - 126 U/L 55   50   AST 15 - 41 U/L 21   22   ALT 0 - 44 U/L 28   40     Latest Reference Range & Units 01/07/23 11:33  Anticardiolipin Ab,IgA,Qn 0 - 11 APL U/mL <9  Anticardiolipin Ab,IgG,Qn 0 - 14 GPL U/mL <9  Anticardiolipin Ab,IgM,Qn 0 - 12 MPL U/mL 46 (H)  (H): Data is abnormally high  Latest Reference Range & Units 01/07/23 11:33  Beta-2 Glycoprotein I Ab, IgG 0 -  20 GPI IgG units <9  Beta-2-Glycoprotein I IgA 0 - 25 GPI IgA units <9  Beta-2-Glycoprotein I IgM 0 - 32 GPI IgM units 69 (H)  (H): Data is abnormally high   Latest Reference Range & Units 01/07/23 11:33  PTT Lupus Anticoagulant 0.0 - 43.5 sec 41.2  DRVVT 0.0 - 47.0 sec 81.0 (H)  Lupus Anticoag Interp  Comment:Results are consistent with the presence of a lupus anticoagulant.   (H): Data is abnormally high   Latest Reference Range & Units 01/07/23 11:33  Antithrombin Activity 75 - 135 % 133  AT III AG PPP IMM-ACNC 72 - 124 % 100     01/07/23 11:33  FACTOR 5 LEIDEN MUTATION: NOT  DETECTED     01/07/23 11:33  PROTHROMBIN GENE MUTATION: NOT DETECTED    Latest Reference Range & Units 01/07/23 11:33  PTT Lupus Anticoagulant 0.0 - 43.5 sec 41.2  DRVVT 0.0 - 47.0 sec 81.0 (H)  dRVVT Mix 0.0 - 40.4 sec 47.8 (H)  dRVVT Confirm 0.8 - 1.2 ratio 1.3 (H)  dPT 0.0 - 47.6 sec 51.1 (H)  dPT Confirm Ratio 0.00 - 1.34 Ratio 1.00  (H): Data is abnormally high   Latest Reference Range & Units 01/07/23 11:36  Protein C-Functional 73 - 180 % 121  Protein C, Total 60 - 150 % 129    Latest Reference Range & Units 01/07/23 11:32  Protein S, Free 61 - 136 % 142 (H)  Protein S, Total 60 - 150 % 131  (H): Data is abnormally high  ASSESSMENT & PLAN:  A 67 y.o. male who I recently began seeing for having bilateral lower extremity DVTs.  In clinic today, I went over his hypercoagulable workup with him, for which all 3 test to check for antiphospholipid syndrome came back abnormal.  Although the lupus anticoagulant screen can be falsely positive in people who are taking Eliquis, the fact that both his IgM beta-2 glycoprotein and cardiolipin antibodies  were elevated definitely raises the suspicion for antiphospholipid syndrome being present.  The patient understands that such tests have to be abnormal on 2 separate occasions, at least 3 months apart from 1 another, for the diagnosis of antiphospholipid  syndrome to be made.  Based upon this, all studies for antiphospholipid syndrome will be rechecked in early January 2025.  If the studies remain very abnormal, this would submit the diagnosis of antiphospholipid syndrome, which would essentially relegate him to lifelong anticoagulation.  For now, he knows to continue taking his Eliquis at 5 mg twice daily.  I will see him back in January 2025 for repeat clinical assessment.  The patient understands all the plans discussed today and is in agreement with them.  I do appreciate Dough, Doris Cheadle, MD for his new consult.   Jaime Dean Kirby Funk, MD

## 2023-01-24 ENCOUNTER — Other Ambulatory Visit: Payer: Self-pay | Admitting: Oncology

## 2023-01-24 ENCOUNTER — Inpatient Hospital Stay (INDEPENDENT_AMBULATORY_CARE_PROVIDER_SITE_OTHER): Payer: Medicare Other | Admitting: Oncology

## 2023-01-24 VITALS — BP 141/77 | HR 76 | Temp 97.9°F | Resp 16 | Ht 65.0 in | Wt 274.0 lb

## 2023-01-24 DIAGNOSIS — I82403 Acute embolism and thrombosis of unspecified deep veins of lower extremity, bilateral: Secondary | ICD-10-CM | POA: Diagnosis not present

## 2023-01-24 DIAGNOSIS — I82413 Acute embolism and thrombosis of femoral vein, bilateral: Secondary | ICD-10-CM

## 2023-03-21 ENCOUNTER — Other Ambulatory Visit: Payer: Medicare Other

## 2023-03-22 ENCOUNTER — Ambulatory Visit
Admission: RE | Admit: 2023-03-22 | Discharge: 2023-03-22 | Disposition: A | Discharge status: Home / Self Care | Payer: Medicare Other | Source: Ambulatory Visit | Attending: Orthopaedic Surgery | Admitting: Orthopaedic Surgery

## 2023-03-22 DIAGNOSIS — M542 Cervicalgia: Secondary | ICD-10-CM

## 2023-03-22 DIAGNOSIS — M5412 Radiculopathy, cervical region: Secondary | ICD-10-CM

## 2023-04-09 ENCOUNTER — Inpatient Hospital Stay: Payer: Medicare Other | Attending: Oncology

## 2023-04-09 DIAGNOSIS — Z86718 Personal history of other venous thrombosis and embolism: Secondary | ICD-10-CM | POA: Diagnosis not present

## 2023-04-09 DIAGNOSIS — Z7901 Long term (current) use of anticoagulants: Secondary | ICD-10-CM | POA: Insufficient documentation

## 2023-04-09 DIAGNOSIS — D6861 Antiphospholipid syndrome: Secondary | ICD-10-CM | POA: Insufficient documentation

## 2023-04-09 DIAGNOSIS — I82413 Acute embolism and thrombosis of femoral vein, bilateral: Secondary | ICD-10-CM

## 2023-04-09 DIAGNOSIS — I82403 Acute embolism and thrombosis of unspecified deep veins of lower extremity, bilateral: Secondary | ICD-10-CM | POA: Diagnosis present

## 2023-04-10 LAB — DRVVT CONFIRM: dRVVT Confirm: 2.1 {ratio} — ABNORMAL HIGH (ref 0.8–1.2)

## 2023-04-10 LAB — LUPUS ANTICOAGULANT
DRVVT: 134.1 s — ABNORMAL HIGH (ref 0.0–47.0)
PTT Lupus Anticoagulant: 43.3 s (ref 0.0–43.5)
Thrombin Time: 20.3 s (ref 0.0–23.0)
dPT Confirm Ratio: 0.91 {ratio} (ref 0.00–1.34)
dPT: 42.1 s (ref 0.0–47.6)

## 2023-04-10 LAB — CARDIOLIPIN ANTIBODIES, IGG, IGM, IGA
Anticardiolipin IgA: <9 [APL'U]/mL (ref 0–11)
Anticardiolipin IgG: <9 [GPL'U]/mL (ref 0–14)
Anticardiolipin IgM: 56 [MPL'U]/mL — ABNORMAL HIGH (ref 0–12)

## 2023-04-10 LAB — BETA-2-GLYCOPROTEIN I ABS, IGG/M/A
Beta-2 Glyco I IgG: <9 GPI IgG units (ref 0–20)
Beta-2-Glycoprotein I IgA: <9 GPI IgA units (ref 0–25)
Beta-2-Glycoprotein I IgM: 55 GPI IgM units — ABNORMAL HIGH (ref 0–32)

## 2023-04-10 LAB — DRVVT MIX: dRVVT Mix: 61.4 s — ABNORMAL HIGH (ref 0.0–40.4)

## 2023-04-15 NOTE — Progress Notes (Signed)
 [facility] [place]  [address] [place],  [place]  [address] [phone]  Clinic Day:  04/16/2023  Referring physician: Madlyn Schirmer, MD   HISTORY OF PRESENT ILLNESS:  The patient is a 68 y.o. male  who I recently began seeing for having bilateral lower extremity DVTs.  He comes in today to go over his 2nd panel of labs to determine whether he fits the criteria for having antiphospholipid syndrome.    Since his last visit, the patient has been doing fairly well.  He denies having any further issues with blood clots.  He continues to take Eliquis  5 mg twice daily for his anticoagulation management.  PHYSICAL EXAM:  Blood pressure 138/66, pulse 79, temperature 97.9 F (36.6 C), temperature source Oral, resp. rate 16, height 5\' 5"  (1.651 m), weight 275 lb (124.7 kg), SpO2 97%. Wt Readings from Last 3 Encounters:  04/16/23 275 lb (124.7 kg)  01/24/23 274 lb (124.3 kg)  01/23/23 274 lb (124.3 kg)   Body mass index is 45.76 kg/m. Performance status (ECOG): 1 - Symptomatic but completely ambulatory Physical Exam Constitutional:      Appearance: Normal appearance. He is obese. He is not ill-appearing.  HENT:     Mouth/Throat:     Mouth: Mucous membranes are moist.     Pharynx: Oropharynx is clear. No oropharyngeal exudate or posterior oropharyngeal erythema.  Cardiovascular:     Rate and Rhythm: Normal rate and regular rhythm.     Heart sounds: No murmur heard.    No friction rub. No gallop.  Pulmonary:     Effort: Pulmonary effort is normal. No respiratory distress.     Breath sounds: Normal breath sounds. No wheezing, rhonchi or rales.  Abdominal:     General: Bowel sounds are normal. There is no distension.     Palpations: Abdomen is soft. There is no mass.     Tenderness: There is no abdominal tenderness.  Musculoskeletal:        General: No swelling.     Right upper leg: Edema present. No swelling.     Left upper leg: Edema present. No  swelling.     Right lower leg: No edema.     Left lower leg: No edema.     Comments: TED hose present, with no visible swelling in his lower legs  Lymphadenopathy:     Cervical: No cervical adenopathy.     Upper Body:     Right upper body: No supraclavicular or axillary adenopathy.     Left upper body: No supraclavicular or axillary adenopathy.     Lower Body: No right inguinal adenopathy. No left inguinal adenopathy.  Skin:    General: Skin is warm.     Coloration: Skin is not jaundiced.     Findings: No lesion or rash.  Neurological:     General: No focal deficit present.     Mental Status: He is alert and oriented to person, place, and time. Mental status is at baseline.  Psychiatric:        Mood and Affect: Mood normal.        Behavior: Behavior normal.        Thought Content: Thought content normal.    LABS:      Latest Ref Rng & Units 01/07/2023   11:33 AM 12/19/2022    9:43 AM 12/18/2022    9:25 PM  CBC  WBC 4.0 - 10.5 K/uL 11.2  10.3    Hemoglobin 13.0 -  17.0 g/dL 03.4  74.2  59.5   Hematocrit 39.0 - 52.0 % 42.5  37.6  40.8   Platelets 150 - 400 K/uL 323  216        Latest Ref Rng & Units 01/07/2023   11:33 AM 12/18/2022    4:50 AM 03/20/2010    7:20 AM  CMP  Glucose 70 - 99 mg/dL 638  756  433   BUN 8 - 23 mg/dL 17  18  12    Creatinine 0.61 - 1.24 mg/dL 2.95  1.88  4.16   Sodium 135 - 145 mmol/L 138  135  137   Potassium 3.5 - 5.1 mmol/L 3.9  4.4  3.8   Chloride 98 - 111 mmol/L 103  99  101   CO2 22 - 32 mmol/L 26  27  29    Calcium  8.9 - 10.3 mg/dL 9.1  9.4  9.2   Total Protein 6.5 - 8.1 g/dL 7.0   6.3   Total Bilirubin 0.3 - 1.2 mg/dL 0.7   0.7   Alkaline Phos 38 - 126 U/L 55   50   AST 15 - 41 U/L 21   22   ALT 0 - 44 U/L 28   40     Latest Reference Range & Units 01/07/23 11:33 04/09/23 09:04  Anticardiolipin Ab,IgA,Qn 0 - 11 APL U/mL <9 <9  Anticardiolipin Ab,IgG,Qn 0 - 14 GPL U/mL <9 <9  Anticardiolipin Ab,IgM,Qn 0 - 12 MPL U/mL 46 (H) 56 (H)  PTT  Lupus Anticoagulant 0.0 - 43.5 sec 41.2 43.3  DRVVT 0.0 - 47.0 sec 81.0 (H) 134.1 (H)  Lupus Anticoag Interp  Comment:Results are consistent with the presence of a lupus anticoagulant.  Comment:Results are consistent with the presence of a lupus anticoagulant.   (H): Data is abnormally high   Latest Reference Range & Units 01/07/23 11:33 04/09/23 09:04  Beta-2  Glycoprotein I Ab, IgG 0 - 20 GPI IgG units <9 <9  Beta-2 -Glycoprotein I IgA 0 - 25 GPI IgA units <9 <9  Beta-2 -Glycoprotein I IgM 0 - 32 GPI IgM units 69 (H) 55 (H)  (H): Data is abnormally high   ASSESSMENT & PLAN:  A 68 y.o. male who I recently began seeing for having bilateral lower extremity DVTs.  In clinic today, I went over his 2nd set of labs with him, for which all 3 tests to check for antiphospholipid syndrome came back abnormal again.   His IgM beta-2  glycoprotein, IgM cardiolipin antibodies and lupus anticoagulant screen were all positive again, 3 months apart from the first set of positive labs.    Based upon this, he fulfulls the diagnosis of having antiphospholipid syndrome, which relegates him to lifelong anticoagulation.  He knows to continue taking his Eliquis  at 5 mg twice daily indefinitely.  I do feel comfortable turning his care back over to his primary care office.  I would not have a problem seeing him in the future if new issues arise that require repeat clinical assessment.   The patient understands all the plans discussed today and is in agreement with them.  Bo Rogue Felicia Horde, MD

## 2023-04-16 ENCOUNTER — Inpatient Hospital Stay (HOSPITAL_BASED_OUTPATIENT_CLINIC_OR_DEPARTMENT_OTHER): Payer: Medicare Other | Admitting: Oncology

## 2023-04-16 VITALS — BP 138/66 | HR 79 | Temp 97.9°F | Resp 16 | Ht 65.0 in | Wt 275.0 lb

## 2023-04-16 DIAGNOSIS — D6861 Antiphospholipid syndrome: Secondary | ICD-10-CM | POA: Insufficient documentation

## 2023-08-28 ENCOUNTER — Ambulatory Visit (HOSPITAL_BASED_OUTPATIENT_CLINIC_OR_DEPARTMENT_OTHER)
Admission: RE | Admit: 2023-08-28 | Discharge: 2023-08-28 | Disposition: A | Discharge status: Home / Self Care | Source: Ambulatory Visit | Attending: Adult Health | Admitting: Adult Health

## 2023-08-28 ENCOUNTER — Other Ambulatory Visit (HOSPITAL_BASED_OUTPATIENT_CLINIC_OR_DEPARTMENT_OTHER): Payer: Self-pay | Admitting: Adult Health

## 2023-08-28 DIAGNOSIS — R310 Gross hematuria: Secondary | ICD-10-CM

## 2023-08-28 LAB — I-STAT CREATININE (MANUAL ENTRY): Creatinine, Ser: 1 (ref 0.50–1.10)

## 2023-08-28 MED ORDER — IOHEXOL 300 MG/ML  SOLN
125.0000 mL | Freq: Once | INTRAMUSCULAR | Status: AC | PRN
  Administered 2023-08-28: 125 mL via INTRAVENOUS

## 2023-09-04 ENCOUNTER — Other Ambulatory Visit (HOSPITAL_BASED_OUTPATIENT_CLINIC_OR_DEPARTMENT_OTHER): Admitting: Radiology

## 2023-09-08 NOTE — Progress Notes (Signed)
 RETURN CLINIC VISIT - LOW BACK PAIN  SUBJECTIVE: Jaime Dean is a 68 y.o. male who returns to clinic with low back pain. Since last clinic visit, patient denies trauma or injuries.  History of Present Illness  Patient denies fevers, chills, loss of bowel and bladder control, saddle anesthesia, and unintentional weight loss.   OBJECTIVE: Vitals:   09/08/23 0821  BP: 122/84  Pulse: 76  Resp: 18  SpO2: 96%    PHYSICAL EXAM: Inspection: Spinal Curvature: Normal lordosis (anterior curvature of the lower back) is noted. Posture: Posture is upright, and gait is antalgic and he uses a cane.  Palpation: Spinous Processes: No tenderness is noted when palpating the spinous processes. Paraspinal Muscles: Paraspinal muscles are tender.  Range of Motion (ROM): Flexion, Extension, Lateral Flexion: limited by >50% in all planes   PROCEDURE PERFORMED: Therapeutic R S1 selective nerve root block.     REASON FOR PROCEDURES: R S1 radicular pain.   SURGEON:   Debby Kansky, M.D.   PROCEDURE:  After obtaining informed consent, the patient was placed in the prone position on the fluoroscopy table.  Blood pressure, pulse, and pulse oximetry was assessed pre and post-procedure. The lumbar skin was prepped and draped in a sterile fashion with Chloraprep and sterile towels.     After raising a skin wheal with 1% lidocaine  and anesthetizing the subcutaneous tissues, a #22 gauge 3.5 inch spinal needle was placed under fluoroscopic control to the R S1 foramen angled caudally.  Approximately 1-2 mL of 300 mg/mL Omnipaque  contrast dye was injected, demonstrating S1 perineural spread. Key fluoroscopic images of the procedure were saved to a local server.  A total of 1.5 mL of 2% preservative free lidocaine  and 1.5 mL of 10 mg per mL of dexamethasone  was instilled around the S1 nerve root.     The patient tolerated the procedure well and was transitioned to the post procedure holding area  uneventfully.  The patient was monitored for adverse allergic, paralytic and hypertensive reactions. The patient was discharged without complication.  ASSESSMENT: Jaime Dean is a 68 y.o. male who returns to clinic with LBP radiating into RLE laterally to the top of the foot. Spontaneous onset 08/2021 -- started about 3 weeks after a fall. Denies LLE pain. The pain is constant and worsening. He denies prior hx of LE pain. Denies b/b incontinence and saddle anesthesia.   XR 08/2021 on [place] shows ML DDD and arthropathy, worst at L5-S1. MRI 6/023 on [place] shows L3-4 L foraminal stenosis and annular tear, L4-5 L mod foraminal stenosis, L5-S1 B/L foraminal stenosis. SABRA   PMHx: DM2 with hgba1c 7.2, OSA on CPAP, kidney stones, post-surgical DVT 2009, lap band (NO NSAIDs)  R L5 snrb 11/12/21 with partial improvement in LBP but he continued to have LE pain. R S1 snrb 01/14/22 with significant improvement in LE pain >50% which remained sustained > 4 months. He reports this injection was better than the first, but he feels like it's wearing off now.  B L345 MBB 03/12/22 was negative. Lyrica  is helping. Taking hydrocodone PRN.   Treated for DVT 12/2022 (thrombectomy?/TPA?) and is now maintained on Eliquis .   PLAN: Follow up in about 4 weeks (around 10/06/2023) for post injection.

## 2023-11-07 ENCOUNTER — Other Ambulatory Visit: Payer: Self-pay | Admitting: Urology

## 2023-11-10 NOTE — Progress Notes (Addendum)
 PCP - Lamar Hobby, MD LOV 09-12-23 epic Cardiologist - no Hematology- Dr. Valaria Kerns clearance in media 11-07-23  PPM/ICD -  Device Orders -  Rep Notified -   Chest x-ray -  EKG - 12-17-22 epic Stress Test -  ECHO -  Cardiac Cath -   Sleep Study -  CPAP -   Fasting Blood Sugar - 130's Checks Blood Sugar __every AM___   Blood Thinner Instructions:Eliquis    hold 2 days Aspirin  Instructions:n/a  ERAS Protcol - PRE-SURGERY -n/a    COVID vaccine -  Activity-- Able to climb a flight of stairs with no CP or SOB  Anesthesia review: DVT, OSA, PTSD, DM2, antiphospholipid disorder-   Patient denies shortness of breath, fever, cough and chest pain at PAT appointment   All instructions explained to the patient, with a verbal understanding of the material. Patient agrees to go over the instructions while at home for a better understanding. Patient also instructed to self quarantine after being tested for COVID-19. The opportunity to ask questions was provided.

## 2023-11-10 NOTE — Patient Instructions (Addendum)
 SURGICAL WAITING ROOM VISITATION  Patients having surgery or a procedure may have no more than 2 support people in the waiting area - these visitors may rotate.    Children under the age of 10 must have an adult with them who is not the patient.  Visitors with respiratory illnesses are discouraged from visiting and should remain at home.  If the patient needs to stay at the hospital during part of their recovery, the visitor guidelines for inpatient rooms apply. Pre-op nurse will coordinate an appropriate time for 1 support person to accompany patient in pre-op.  This support person may not rotate.    Please refer to the [facility] website for the visitor guidelines for Inpatients (after your surgery is over and you are in a regular room).       Your procedure is scheduled on: 11-21-23   Report to [facility] Main Entrance    Report to admitting at      1:15 PM   Call this number if you have problems the morning of surgery [phone]   Do not eat food :After Midnight.   After Midnight you may have the following liquids until _0930_____ AM/ DAY OF SURGERY   then nothing by mouth  Water Gatorade (NO RED)                        If you have questions, please contact your surgeon's office.   FOLLOW ANY ADDITIONAL PRE OP INSTRUCTIONS YOU RECEIVED FROM YOUR SURGEON'S OFFICE!!!     Oral Hygiene is also important to reduce your risk of infection.                                    Remember - BRUSH YOUR TEETH THE MORNING OF SURGERY WITH YOUR REGULAR TOOTHPASTE  DENTURES WILL BE REMOVED PRIOR TO SURGERY PLEASE DO NOT APPLY Poly grip OR ADHESIVES!!!   Do NOT smoke after Midnight   Stop all vitamins and herbal supplements 7 days before surgery.   Take these medicines the morning of surgery with A SIP OF WATER: rosuvastatin , lyrica , fluoxetine , depakote , Buspirone    if needed,oxycodone  if needed,methocarbamol, fexofenadine, bring rescue inhaler  DO NOT TAKE ANY ORAL  DIABETIC MEDICATIONS DAY OF YOUR SURGERY  Bring CPAP mask and tubing day of surgery.                              You may not have any metal on your body including hair pins, jewelry, and body piercing             Do not wear  lotions, powders, perfumes/cologne, or deodorant               Men may shave face and neck.   Do not bring valuables to the hospital. [place] IS NOT             RESPONSIBLE   FOR VALUABLES.   Contacts, glasses, dentures or bridgework may not be worn into surgery.   Bring small overnight bag day of surgery.   DO NOT BRING YOUR HOME MEDICATIONS TO THE HOSPITAL. PHARMACY WILL DISPENSE MEDICATIONS LISTED ON YOUR MEDICATION LIST TO YOU DURING YOUR ADMISSION IN THE HOSPITAL!    Patients discharged on the day of surgery will not be allowed to drive home.  Someone NEEDS to  stay with you for the first 24 hours after anesthesia.   Special Instructions: Bring a copy of your healthcare power of attorney and living will documents the day of surgery if you haven't scanned them before.              Please read over the following fact sheets you were given: IF YOU HAVE QUESTIONS ABOUT YOUR PRE-OP INSTRUCTIONS PLEASE CALL [phone].   If you test positive for Covid or have been in contact with anyone that has tested positive in the last 10 days please notify you surgeon.    [place] - Preparing for Surgery Before surgery, you can play an important role.  Because skin is not sterile, your skin needs to be as free of germs as possible.  You can reduce the number of germs on your skin by washing with CHG (chlorahexidine gluconate) soap before surgery.  CHG is an antiseptic cleaner which kills germs and bonds with the skin to continue killing germs even after washing. Please DO NOT use if you have an allergy to CHG or antibacterial soaps.  If your skin becomes reddened/irritated stop using the CHG and inform your nurse when you arrive at Short Stay. Do not shave (including  legs and underarms) for at least 48 hours prior to the first CHG shower.  You may shave your face/neck. Please follow these instructions carefully:  1.  Shower with CHG Soap the night before surgery and the  morning of Surgery.  2.  If you choose to wash your hair, wash your hair first as usual with your  normal  shampoo.  3.  After you shampoo, rinse your hair and body thoroughly to remove the  shampoo.                            4.  Use CHG as you would any other liquid soap.  You can apply chg directly  to the skin and wash                       Gently with a scrungie or clean washcloth.  5.  Apply the CHG Soap to your body ONLY FROM THE NECK DOWN.   Do not use on face/ open                           Wound or open sores. Avoid contact with eyes, ears mouth and genitals (private parts).                       Wash face,  Genitals (private parts) with your normal soap.             6.  Wash thoroughly, paying special attention to the area where your surgery  will be performed.  7.  Thoroughly rinse your body with warm water from the neck down.  8.  DO NOT shower/wash with your normal soap after using and rinsing off  the CHG Soap.                9.  Pat yourself dry with a clean towel.            10.  Wear clean pajamas.            11.  Place clean sheets on your bed the night of your first shower and do not  sleep  with pets. Day of Surgery : Do not apply any lotions/deodorants the morning of surgery.  Please wear clean clothes to the hospital/surgery center.  FAILURE TO FOLLOW THESE INSTRUCTIONS MAY RESULT IN THE CANCELLATION OF YOUR SURGERY PATIENT SIGNATURE_________________________________  NURSE SIGNATURE__________________________________  ________________________________________________________________________

## 2023-11-14 ENCOUNTER — Encounter (HOSPITAL_COMMUNITY): Payer: Self-pay

## 2023-11-14 ENCOUNTER — Encounter (HOSPITAL_COMMUNITY)
Admission: RE | Admit: 2023-11-14 | Discharge: 2023-11-14 | Disposition: A | Discharge status: Home / Self Care | Source: Ambulatory Visit | Attending: Urology | Admitting: Urology

## 2023-11-14 ENCOUNTER — Other Ambulatory Visit: Payer: Self-pay

## 2023-11-14 VITALS — BP 133/75 | HR 76 | Temp 98.1°F | Resp 16 | Ht 65.0 in | Wt 263.0 lb

## 2023-11-14 DIAGNOSIS — D6861 Antiphospholipid syndrome: Secondary | ICD-10-CM | POA: Insufficient documentation

## 2023-11-14 DIAGNOSIS — F319 Bipolar disorder, unspecified: Secondary | ICD-10-CM | POA: Insufficient documentation

## 2023-11-14 DIAGNOSIS — Z7984 Long term (current) use of oral hypoglycemic drugs: Secondary | ICD-10-CM | POA: Diagnosis not present

## 2023-11-14 DIAGNOSIS — N202 Calculus of kidney with calculus of ureter: Secondary | ICD-10-CM | POA: Diagnosis not present

## 2023-11-14 DIAGNOSIS — G4730 Sleep apnea, unspecified: Secondary | ICD-10-CM | POA: Insufficient documentation

## 2023-11-14 DIAGNOSIS — Z01812 Encounter for preprocedural laboratory examination: Secondary | ICD-10-CM | POA: Diagnosis not present

## 2023-11-14 DIAGNOSIS — Z01818 Encounter for other preprocedural examination: Secondary | ICD-10-CM | POA: Diagnosis present

## 2023-11-14 DIAGNOSIS — F4310 Post-traumatic stress disorder, unspecified: Secondary | ICD-10-CM | POA: Insufficient documentation

## 2023-11-14 DIAGNOSIS — Z7901 Long term (current) use of anticoagulants: Secondary | ICD-10-CM | POA: Insufficient documentation

## 2023-11-14 DIAGNOSIS — Z86718 Personal history of other venous thrombosis and embolism: Secondary | ICD-10-CM | POA: Diagnosis not present

## 2023-11-14 DIAGNOSIS — E119 Type 2 diabetes mellitus without complications: Secondary | ICD-10-CM | POA: Insufficient documentation

## 2023-11-14 HISTORY — DX: Unspecified osteoarthritis, unspecified site: M19.90

## 2023-11-14 HISTORY — DX: Antiphospholipid syndrome: D68.61

## 2023-11-14 HISTORY — DX: Unspecified asthma, uncomplicated: J45.909

## 2023-11-14 HISTORY — DX: Personal history of urinary calculi: Z87.442

## 2023-11-14 LAB — CBC
HCT: 41.7 % (ref 39.0–52.0)
Hemoglobin: 13.1 g/dL (ref 13.0–17.0)
MCH: 26.2 pg (ref 26.0–34.0)
MCHC: 31.4 g/dL (ref 30.0–36.0)
MCV: 83.4 fL (ref 80.0–100.0)
Platelets: 220 K/uL (ref 150–400)
RBC: 5 MIL/uL (ref 4.22–5.81)
RDW: 15.2 % (ref 11.5–15.5)
WBC: 11.2 K/uL — ABNORMAL HIGH (ref 4.0–10.5)
nRBC: 0 % (ref 0.0–0.2)

## 2023-11-14 LAB — BASIC METABOLIC PANEL WITH GFR
Anion gap: 8 (ref 5–15)
BUN: 14 mg/dL (ref 8–23)
CO2: 26 mmol/L (ref 22–32)
Calcium: 9.1 mg/dL (ref 8.9–10.3)
Chloride: 105 mmol/L (ref 98–111)
Creatinine, Ser: 0.95 mg/dL (ref 0.61–1.24)
GFR, Estimated: >60 mL/min (ref >60)
Glucose, Bld: 122 mg/dL — ABNORMAL HIGH (ref 70–99)
Potassium: 3.9 mmol/L (ref 3.5–5.1)
Sodium: 139 mmol/L (ref 135–145)

## 2023-11-14 LAB — GLUCOSE, CAPILLARY: Glucose-Capillary: 123 mg/dL — ABNORMAL HIGH (ref 70–99)

## 2023-11-16 LAB — HEMOGLOBIN A1C
Hgb A1c MFr Bld: 6.8 % — ABNORMAL HIGH (ref 4.8–5.6)
Mean Plasma Glucose: 148 mg/dL

## 2023-11-17 NOTE — Progress Notes (Signed)
 Anesthesia Chart Review   Case: [id number] Date/Time: 11/21/23 1515   Procedure: CYSTOSCOPY/URETEROSCOPY/HOLMIUM LASER/STENT PLACEMENT (Right)   Anesthesia type: General   Diagnosis:      Calculus of ureter [N20.1]     Calculus of kidney [N20.0]   Pre-op diagnosis: RIGHT URETERAL STONE AND RIGHT RENAL STONE   Location: [facility] PROCEDURE [facility] / [facility]   Surgeons: Carolee Sherwood JONETTA DOUGLAS, MD       DISCUSSION:68 y.o. former smoker with h/o PTSD, bipolar disorder, sleep apnea w/cpap, non-insulin  dependent DM II (A1C 6.8), antiphospholipid syndrome, DVT, right ureteral stone and right renal stone scheduled for above procedure 11/21/23 with Dr. Sherwood Carolee.   S/p C5-6 ACDF 06/03/2023. No anesthesia complications noted.   Pt follows with hematology for antiphospholipid syndrome.  Clearance received from hematology which states pt can hold Eliquis  2 days prior to procedure.   H/o left DVT. Follows with vascular surgery, last seen 01/23/2023. Pt asymptomatic. IVC filter in place left lower extremity.Per notes he will remain on Eliquis , advised to use daily compression socks.  Advised to follow up with vascular prn.   Pt reports he can climb a flight of stairs without difficulty, denies chest pain or shortness of breath.   VS: BP 133/75   Pulse 76   Temp 36.7 C (Oral)   Resp 16   Ht 5' 5 (1.651 m)   Wt 119.3 kg   SpO2 98%   BMI 43.77 kg/m   PROVIDERS: Ofilia Lamar CROME, MD is PCP    LABS: Labs reviewed: Acceptable for surgery. (all labs ordered are listed, but only abnormal results are displayed)  Labs Reviewed  HEMOGLOBIN A1C - Abnormal; Notable for the following components:      Result Value   Hgb A1c MFr Bld 6.8 (*)    All other components within normal limits  BASIC METABOLIC PANEL WITH GFR - Abnormal; Notable for the following components:   Glucose, Bld 122 (*)    All other components within normal limits  CBC - Abnormal; Notable for the following components:   WBC 11.2 (*)    All  other components within normal limits  GLUCOSE, CAPILLARY - Abnormal; Notable for the following components:   Glucose-Capillary 123 (*)    All other components within normal limits     IMAGES:   EKG:   CV:  Past Medical History:  Diagnosis Date   Antiphospholipid syndrome (HCC)    Arthritis    Asthma    mild no issues   Bipolar 1 disorder (HCC)    Diabetes mellitus type II    Glaucoma    Hearing loss    History of blood clots    DVT   History of kidney stones    Manic depression (HCC)    Plantar fasciitis    Pneumonia    As a child   Post traumatic stress disorder (PTSD)    [place]   Sleep apnea    cpap   Wears glasses     Past Surgical History:  Procedure Laterality Date   CATARACT EXTRACTION Bilateral    COLLATERAL LIGAMENT REPAIR, KNEE     CYSTOSCOPY     WITH STONE REMOVAL   EYE SURGERY Bilateral    LASER REPAIR OF GLAUCOMA   FOREIGN OBJECT REMOVED FROM RIGHT SCALP     IVC VENOGRAPHY N/A 12/18/2022   Procedure: IVC Venography;  Surgeon: Lanis Fonda BRAVO, MD;  Location: [facility] INVASIVE CV LAB;  Service: Cardiovascular;  Laterality: N/A;   KNEE  ARTHROPLASTY Bilateral    KNEE ARTHROSCOPY     right knee   LAPROSCOPIC GASTRIC BANDING     LEFT EXTENSOR TENDON WRIST REPAIR     LOWER EXTREMITY VENOGRAPHY Right 12/18/2022   Procedure: LOWER EXTREMITY VENOGRAPHY;  Surgeon: Lanis Fonda BRAVO, MD;  Location: [facility] INVASIVE CV LAB;  Service: Cardiovascular;  Laterality: Right;   PERIPHERAL VASCULAR THROMBECTOMY N/A 12/18/2022   Procedure: PERIPHERAL VASCULAR THROMBECTOMY;  Surgeon: Lanis Fonda BRAVO, MD;  Location: [facility] INVASIVE CV LAB;  Service: Cardiovascular;  Laterality: N/A;   PERIPHERAL VASCULAR ULTRASOUND/IVUS Right 12/18/2022   Procedure: Peripheral Vascular Ultrasound/IVUS;  Surgeon: Lanis Fonda BRAVO, MD;  Location: [facility] INVASIVE CV LAB;  Service: Cardiovascular;  Laterality: Right;   TONSILLECTOMY AND ADENOIDECTOMY     TRANSURETHRAL RESECTION OF PROSTATE      UMBILICAL HERNIA REPAIR      MEDICATIONS:  albuterol (VENTOLIN HFA) 108 (90 Base) MCG/ACT inhaler   apixaban  (ELIQUIS ) 5 MG TABS tablet   busPIRone  (BUSPAR ) 10 MG tablet   divalproex  (DEPAKOTE  ER) 500 MG 24 hr tablet   ferrous sulfate 325 (65 FE) MG tablet   fexofenadine (ALLEGRA) 180 MG tablet   FLUoxetine  (PROZAC ) 40 MG capsule   JANUVIA 100 MG tablet   metFORMIN (GLUCOPHAGE) 1000 MG tablet   methocarbamol (ROBAXIN) 500 MG tablet   ondansetron  (ZOFRAN -ODT) 8 MG disintegrating tablet   oxyCODONE -acetaminophen  (PERCOCET/ROXICET) 5-325 MG tablet   pregabalin  (LYRICA ) 225 MG capsule   rosuvastatin  (CRESTOR ) 20 MG tablet   No current facility-administered medications for this encounter.     Harlene Hoots Ward, PA-C [facility]-Surgical Testing [phone]

## 2023-11-17 NOTE — Progress Notes (Signed)
 Wife called and informed that patient is positive for COVID.  I informed her that she would need to contact office to inform Dr. Carolee    Also contacted OR / eleanor of same. Paitnet is due for surgery on Friday 11/21/23

## 2023-11-17 NOTE — Anesthesia Preprocedure Evaluation (Signed)
 Anesthesia Evaluation    Airway        Dental   Pulmonary former smoker          Cardiovascular      Neuro/Psych    GI/Hepatic   Endo/Other  diabetes    Renal/GU      Musculoskeletal   Abdominal   Peds  Hematology   Anesthesia Other Findings   Reproductive/Obstetrics                              Anesthesia Physical Anesthesia Plan  ASA:   Anesthesia Plan:    Post-op Pain Management:    Induction:   PONV Risk Score and Plan:   Airway Management Planned:   Additional Equipment:   Intra-op Plan:   Post-operative Plan:   Informed Consent:   Plan Discussed with:   Anesthesia Plan Comments: (See PAT note 11/14/2023)        Anesthesia Quick Evaluation

## 2023-11-21 ENCOUNTER — Encounter (HOSPITAL_COMMUNITY): Payer: Self-pay | Admitting: Medical

## 2023-11-21 ENCOUNTER — Inpatient Hospital Stay: Admitting: Oncology

## 2023-11-21 ENCOUNTER — Encounter (HOSPITAL_COMMUNITY): Admission: RE | Payer: Self-pay | Source: Home / Self Care

## 2023-11-21 ENCOUNTER — Ambulatory Visit (HOSPITAL_COMMUNITY): Admission: RE | Admit: 2023-11-21 | Source: Home / Self Care | Admitting: Urology

## 2023-11-21 SURGERY — CYSTOSCOPY/URETEROSCOPY/HOLMIUM LASER/STENT PLACEMENT
Anesthesia: General | Laterality: Right

## 2023-11-27 ENCOUNTER — Other Ambulatory Visit: Payer: Self-pay | Admitting: Urology

## 2023-12-24 ENCOUNTER — Encounter (HOSPITAL_COMMUNITY): Payer: Self-pay | Admitting: Urology

## 2023-12-24 NOTE — Progress Notes (Signed)
 Cardiac clearance by Dr Ezzard dated 11/11/23 in Epic under media tab. Eliquis  instructions to hold two days prior.          Spoke w/ via phone for pre-op interview--- [facility] needs dos----  BMP, EKG, CBG, A1C per anesthesia.       Lab results------ COVID test -----patient states asymptomatic no test needed Arrive at -------1030 NPO after MN NO Solid Food.  Clear liquids from MN until---0930 Pre-Surgery Ensure or G2:  Med rec completed Medications to take morning of surgery -----Bring Albuterol inhaler. Depakote , Prozac  and Lyrica . Diabetic medication -----NONE AM of surgery.  GLP1 agonist last dose: GLP1 instructions:  Patient instructed no nail polish to be worn day of surgery Patient instructed to bring photo id and insurance card day of surgery Patient aware to have Driver (ride ) / caregiver    for 24 hours after surgery - Wife Meilech Virts Patient Special Instructions ----- Pre-Op special Instructions -----  Patient verbalized understanding of instructions that were given at this phone interview. Patient denies chest pain, sob, fever, cough at the interview.

## 2023-12-26 ENCOUNTER — Ambulatory Visit (HOSPITAL_COMMUNITY)

## 2023-12-26 ENCOUNTER — Other Ambulatory Visit: Payer: Self-pay

## 2023-12-26 ENCOUNTER — Encounter (HOSPITAL_COMMUNITY)
Admission: RE | Disposition: A | Discharge status: Home / Self Care | Payer: Self-pay | Source: Home / Self Care | Attending: Urology

## 2023-12-26 ENCOUNTER — Encounter (HOSPITAL_COMMUNITY): Payer: Self-pay | Admitting: Urology

## 2023-12-26 ENCOUNTER — Ambulatory Visit (HOSPITAL_COMMUNITY)
Admission: RE | Admit: 2023-12-26 | Discharge: 2023-12-26 | Disposition: A | Discharge status: Home / Self Care | Attending: Urology | Admitting: Urology

## 2023-12-26 ENCOUNTER — Ambulatory Visit (HOSPITAL_BASED_OUTPATIENT_CLINIC_OR_DEPARTMENT_OTHER)

## 2023-12-26 DIAGNOSIS — G4733 Obstructive sleep apnea (adult) (pediatric): Secondary | ICD-10-CM | POA: Diagnosis not present

## 2023-12-26 DIAGNOSIS — F319 Bipolar disorder, unspecified: Secondary | ICD-10-CM | POA: Insufficient documentation

## 2023-12-26 DIAGNOSIS — N202 Calculus of kidney with calculus of ureter: Secondary | ICD-10-CM

## 2023-12-26 DIAGNOSIS — F419 Anxiety disorder, unspecified: Secondary | ICD-10-CM | POA: Diagnosis not present

## 2023-12-26 DIAGNOSIS — D6861 Antiphospholipid syndrome: Secondary | ICD-10-CM | POA: Insufficient documentation

## 2023-12-26 DIAGNOSIS — E119 Type 2 diabetes mellitus without complications: Secondary | ICD-10-CM | POA: Diagnosis not present

## 2023-12-26 DIAGNOSIS — Z87891 Personal history of nicotine dependence: Secondary | ICD-10-CM | POA: Insufficient documentation

## 2023-12-26 DIAGNOSIS — D649 Anemia, unspecified: Secondary | ICD-10-CM | POA: Diagnosis not present

## 2023-12-26 DIAGNOSIS — E785 Hyperlipidemia, unspecified: Secondary | ICD-10-CM

## 2023-12-26 DIAGNOSIS — Z86718 Personal history of other venous thrombosis and embolism: Secondary | ICD-10-CM | POA: Insufficient documentation

## 2023-12-26 DIAGNOSIS — Z7984 Long term (current) use of oral hypoglycemic drugs: Secondary | ICD-10-CM | POA: Diagnosis not present

## 2023-12-26 HISTORY — PX: CYSTOSCOPY/URETEROSCOPY/HOLMIUM LASER/STENT PLACEMENT: SHX6546

## 2023-12-26 LAB — BASIC METABOLIC PANEL WITH GFR
Anion gap: 10 (ref 5–15)
BUN: 12 mg/dL (ref 8–23)
CO2: 24 mmol/L (ref 22–32)
Calcium: 9.3 mg/dL (ref 8.9–10.3)
Chloride: 105 mmol/L (ref 98–111)
Creatinine, Ser: 0.94 mg/dL (ref 0.61–1.24)
GFR, Estimated: >60 mL/min (ref >60)
Glucose, Bld: 199 mg/dL — ABNORMAL HIGH (ref 70–99)
Potassium: 4 mmol/L (ref 3.5–5.1)
Sodium: 139 mmol/L (ref 135–145)

## 2023-12-26 LAB — GLUCOSE, CAPILLARY
Glucose-Capillary: 184 mg/dL — ABNORMAL HIGH (ref 70–99)
Glucose-Capillary: 163 mg/dL — ABNORMAL HIGH (ref 70–99)

## 2023-12-26 LAB — HEMOGLOBIN A1C
Hgb A1c MFr Bld: 7.3 % — ABNORMAL HIGH (ref 4.8–5.6)
Mean Plasma Glucose: 162.81 mg/dL

## 2023-12-26 SURGERY — CYSTOSCOPY/URETEROSCOPY/HOLMIUM LASER/STENT PLACEMENT
Anesthesia: General | Laterality: Right

## 2023-12-26 MED ORDER — FENTANYL CITRATE (PF) 100 MCG/2ML IJ SOLN
INTRAMUSCULAR | Status: DC | PRN
  Administered 2023-12-26: 100 ug via INTRAVENOUS

## 2023-12-26 MED ORDER — LACTATED RINGERS IV SOLN: INTRAVENOUS | Status: DC

## 2023-12-26 MED ORDER — PHENYLEPHRINE 80 MCG/ML (10ML) SYRINGE FOR IV PUSH (FOR BLOOD PRESSURE SUPPORT)
PREFILLED_SYRINGE | INTRAVENOUS | Status: DC | PRN
  Administered 2023-12-26 (×7): 80 ug via INTRAVENOUS

## 2023-12-26 MED ORDER — ONDANSETRON HCL 4 MG/2ML IJ SOLN
INTRAMUSCULAR | Status: DC | PRN
  Administered 2023-12-26: 4 mg via INTRAVENOUS

## 2023-12-26 MED ORDER — ONDANSETRON HCL 4 MG/2ML IJ SOLN
INTRAMUSCULAR | Status: AC
  Filled 2023-12-26: qty 2

## 2023-12-26 MED ORDER — LIDOCAINE 2% (20 MG/ML) 5 ML SYRINGE
INTRAMUSCULAR | Status: AC
  Filled 2023-12-26: qty 5

## 2023-12-26 MED ORDER — INSULIN ASPART 100 UNIT/ML IJ SOLN
0.0000 [IU] | INTRAMUSCULAR | Status: DC | PRN
  Administered 2023-12-26: 4 [IU] via SUBCUTANEOUS

## 2023-12-26 MED ORDER — CHLORHEXIDINE GLUCONATE 0.12 % MT SOLN
OROMUCOSAL | Status: AC
  Administered 2023-12-26: 15 mL via OROMUCOSAL
  Filled 2023-12-26: qty 15

## 2023-12-26 MED ORDER — LIDOCAINE HCL URETHRAL/MUCOSAL 2 % EX GEL
CUTANEOUS | Status: AC
  Filled 2023-12-26: qty 11

## 2023-12-26 MED ORDER — IOHEXOL 300 MG/ML  SOLN
INTRAMUSCULAR | Status: DC | PRN
  Administered 2023-12-26: 10 mL via URETHRAL

## 2023-12-26 MED ORDER — EPHEDRINE 5 MG/ML INJ
INTRAVENOUS | Status: AC
  Filled 2023-12-26: qty 5

## 2023-12-26 MED ORDER — DEXAMETHASONE SODIUM PHOSPHATE 10 MG/ML IJ SOLN
INTRAMUSCULAR | Status: DC | PRN
  Administered 2023-12-26: 4 mg via INTRAVENOUS

## 2023-12-26 MED ORDER — ALBUMIN HUMAN 5 % IV SOLN: INTRAVENOUS | Status: DC | PRN

## 2023-12-26 MED ORDER — DEXAMETHASONE SODIUM PHOSPHATE 10 MG/ML IJ SOLN
INTRAMUSCULAR | Status: AC
  Filled 2023-12-26: qty 1

## 2023-12-26 MED ORDER — SODIUM CHLORIDE 0.9 % IR SOLN
Status: DC | PRN
  Administered 2023-12-26: 3000 mL via INTRAVESICAL

## 2023-12-26 MED ORDER — LIDOCAINE 2% (20 MG/ML) 5 ML SYRINGE
INTRAMUSCULAR | Status: DC | PRN
  Administered 2023-12-26: 100 mg via INTRAVENOUS

## 2023-12-26 MED ORDER — ORAL CARE MOUTH RINSE: 15.0000 mL | Freq: Once | OROMUCOSAL | Status: AC

## 2023-12-26 MED ORDER — EPHEDRINE SULFATE-NACL 50-0.9 MG/10ML-% IV SOSY
PREFILLED_SYRINGE | INTRAVENOUS | Status: DC | PRN
  Administered 2023-12-26: 2.5 mg via INTRAVENOUS

## 2023-12-26 MED ORDER — INSULIN ASPART 100 UNIT/ML IJ SOLN
INTRAMUSCULAR | Status: AC
  Filled 2023-12-26: qty 1

## 2023-12-26 MED ORDER — GENTAMICIN SULFATE 40 MG/ML IJ SOLN
5.0000 mg/kg | INTRAVENOUS | Status: AC
  Administered 2023-12-26: 425.2 mg via INTRAVENOUS
  Filled 2023-12-26: qty 10.75

## 2023-12-26 MED ORDER — CHLORHEXIDINE GLUCONATE 0.12 % MT SOLN: 15.0000 mL | Freq: Once | OROMUCOSAL | Status: AC

## 2023-12-26 MED ORDER — OXYCODONE-ACETAMINOPHEN 5-325 MG PO TABS: 1.0000 | ORAL_TABLET | Freq: Every day | ORAL | 0 refills | Status: AC

## 2023-12-26 MED ORDER — PROPOFOL 10 MG/ML IV BOLUS
INTRAVENOUS | Status: DC | PRN
  Administered 2023-12-26: 200 mg via INTRAVENOUS

## 2023-12-26 MED ORDER — FENTANYL CITRATE (PF) 100 MCG/2ML IJ SOLN
INTRAMUSCULAR | Status: AC
  Filled 2023-12-26: qty 2

## 2023-12-26 SURGICAL SUPPLY — 18 items
BAG DRAIN URO-CYSTO SKYTR STRL (DRAIN) ×1 IMPLANT
BASKET STONE NCOMPASS (UROLOGICAL SUPPLIES) IMPLANT
CATH URETL OPEN 5X70 (CATHETERS) IMPLANT
CATH URETL OPEN END 6FR 70 (CATHETERS) IMPLANT
FIBER LASER FLEXIVA PULSE EA (MISCELLANEOUS) IMPLANT
GLOVE BIO SURGEON STRL SZ7.5 (GLOVE) ×1 IMPLANT
GOWN STRL REUS W/ TWL XL LVL3 (GOWN DISPOSABLE) ×1 IMPLANT
GUIDEWIRE STR DUAL SENSOR (WIRE) ×1 IMPLANT
KIT TURNOVER KIT B (KITS) ×1 IMPLANT
LASER FIB FLEXIVA PULSE ID 365 (Laser) IMPLANT
MANIFOLD NEPTUNE II (INSTRUMENTS) ×1 IMPLANT
PACK CYSTO (CUSTOM PROCEDURE TRAY) ×1 IMPLANT
SHEATH NAVIGATOR HD 12/14X46 (SHEATH) IMPLANT
SLEEVE SCD COMPRESS KNEE MED (STOCKING) ×1 IMPLANT
SOL .9 NS 3000ML IRR UROMATIC (IV SOLUTION) IMPLANT
STENT URET 6FRX24 CONTOUR (STENTS) IMPLANT
TUBE CONNECTING 12X1/4 (SUCTIONS) IMPLANT
TUBING UROLOGY SET (TUBING) ×1 IMPLANT

## 2023-12-26 NOTE — OR Nursing (Signed)
 Orders received to resume eiquis on Monday September 29th.  If bleeding picks up stop.  Patient and patients wife informed of Dr. Clifton orders. Patient also informed to let Dr. Carolee know and the MD that manages Eliquis  know if stopping medication.  Andree Kerns rn

## 2023-12-26 NOTE — Transfer of Care (Signed)
 Immediate Anesthesia Transfer of Care Note  Patient: Nolon BIRCH [place]  Procedure(s) Performed: CYSTOSCOPY/URETEROSCOPY/HOLMIUM LASER/STENT PLACEMENT (Right)  Patient Location: PACU  Anesthesia Type:General  Level of Consciousness: awake, alert , oriented, and patient cooperative  Airway & Oxygen Therapy: Patient Spontanous Breathing and Patient connected to nasal cannula oxygen  Post-op Assessment: Report given to RN, Post -op Vital signs reviewed and stable, Patient moving all extremities X 4, and Patient able to stick tongue midline  Post vital signs: Reviewed and stable  Last Vitals:  Vitals Value Taken Time  BP 128/72 12/26/23 13:28  Temp 36.4 C 12/26/23 13:28  Pulse 74 12/26/23 13:29  Resp 17 12/26/23 13:29  SpO2 91 % 12/26/23 13:29  Vitals shown include unfiled device data.  Last Pain:  Vitals:   12/26/23 1047  TempSrc: Oral  PainSc: 5       Patients Stated Pain Goal: 5 (12/26/23 1047)  Complications: No notable events documented.

## 2023-12-26 NOTE — Anesthesia Preprocedure Evaluation (Addendum)
 Anesthesia Evaluation  Patient identified by MRN, date of birth, ID band Patient awake    Reviewed: Allergy & Precautions, NPO status , Patient's Chart, lab work & pertinent test results  History of Anesthesia Complications Negative for: history of anesthetic complications  Airway Mallampati: II       Dental  (+) Poor Dentition, Dental Advisory Given   Pulmonary former smoker   breath sounds clear to auscultation       Cardiovascular + DVT   Rhythm:Regular Rate:Normal     Neuro/Psych   Anxiety  Bipolar Disorder      GI/Hepatic   Endo/Other  diabetes, Type 2    Renal/GU      Musculoskeletal   Abdominal   Peds  Hematology  (+) Blood dyscrasia, anemia Antiphospholipid Syndrome   Anesthesia Other Findings   Reproductive/Obstetrics                              Anesthesia Physical Anesthesia Plan  ASA: 3  Anesthesia Plan: General   Post-op Pain Management:    Induction: Intravenous  PONV Risk Score and Plan: 1  Airway Management Planned: LMA  Additional Equipment:   Intra-op Plan:   Post-operative Plan: Extubation in OR  Informed Consent:      Dental advisory given  Plan Discussed with:   Anesthesia Plan Comments:          Anesthesia Quick Evaluation

## 2023-12-26 NOTE — Discharge Instructions (Addendum)
[facility] [phone] Post Ureteroscopy With or Without Stent Instructions  Definitions:  Ureter: The duct that transports urine from the kidney to the bladder. Stent:   A plastic hollow tube that is placed into the ureter, from the kidney to the                 bladder to prevent the ureter from swelling shut.  GENERAL INSTRUCTIONS:  Despite the fact that no skin incisions were used, the area around the ureter and bladder is raw and irritated. The stent is a foreign body which will further irritate the bladder wall. This irritation is manifested by increased frequency of urination, both day and night, and by an increase in the urge to urinate. In some, the urge to urinate is present almost always. Sometimes the urge is strong enough that you may not be able to stop yourself from urinating. The only real cure is to remove the stent and then give time for the bladder wall to heal which can't be done until the danger of the ureter swelling shut has passed, which varies.  You may see some blood in your urine while the stent is in place and a few days afterwards. Do not be alarmed, even if the urine was clear for a while. Get off your feet and drink lots of fluids until clearing occurs. If you start to pass clots or don't improve, call us.  DIET: You may return to your normal diet immediately. Because of the raw surface of your bladder, alcohol, spicy foods, acid type foods and drinks with caffeine may cause irritation or frequency and should be used in moderation. To keep your urine flowing freely and to avoid constipation, drink plenty of fluids during the day ( 8-10 glasses ). Tip: Avoid cranberry juice because it is very acidic.  ACTIVITY: Your physical activity doesn't need to be restricted. However, if you are very active, you may see some blood in your urine. We suggest that you reduce your activity under these circumstances until the bleeding has stopped.  BOWELS: It is  important to keep your bowels regular during the postoperative period. Straining with bowel movements can cause bleeding. A bowel movement every other day is reasonable. Use a mild laxative if needed, such as Milk of Magnesia 2-3 tablespoons, or 2 Dulcolax tablets. Call if you continue to have problems. If you have been taking narcotics for pain, before, during or after your surgery, you may be constipated. Take a laxative if necessary.   MEDICATION: You should resume your pre-surgery medications unless told not to. You may take oxybutynin or flomax if prescribed for bladder spasms or discomfort from the stent Take pain medication as directed for pain refractory to conservative management  PROBLEMS YOU SHOULD REPORT TO [place]: Fevers over 100.5 Fahrenheit. Heavy bleeding, or clots ( See above notes about blood in urine ). Inability to urinate. Drug reactions ( hives, rash, nausea, vomiting, diarrhea ). Severe burning or pain with urination that is not improving.  Post Anesthesia Home Care Instructions  Activity: Get plenty of rest for the remainder of the day. A responsible adult should stay with you for 24 hours following the procedure.  For the next 24 hours, DO NOT: -Drive a car -Advertising copywriter -Drink alcoholic beverages -Take any medication unless instructed by your physician -Make any legal decisions or sign important papers.  Meals: Start with liquid foods such as gelatin or soup. Progress to regular foods as tolerated. Avoid greasy, spicy, heavy  foods. If nausea and/or vomiting occur, drink only clear liquids until the nausea and/or vomiting subsides. Call your physician if vomiting continues.  Special Instructions/Symptoms: Your throat may feel dry or sore from the anesthesia or the breathing tube placed in your throat during surgery. If this causes discomfort, gargle with warm salt water. The discomfort should disappear within 24 hours.

## 2023-12-26 NOTE — Op Note (Signed)
 Operative Note  Preoperative diagnosis:  1.  Right renal and ureteral calculi  Postoperative diagnosis: 1.  Right renal and ureteral calculi  Procedure(s): 1.  Cystoscopy with right retrograde pyelogram, right ureteroscopy with laser lithotripsy and stone extraction, ureteral stent placement  Surgeon: Sherwood Edison, MD  Assistants: None  Anesthesia: General  Complications: None immediate  EBL: Minimal  Specimens: 1.  Renal calculi  Drains/Catheters: 1.  6 x 24 double-J ureteral stent  Intraoperative findings: 1.  Normal anterior urethra 2.  Nonobstructing prostate 3.  Bladder mucosa without any tumors or masses 4.  Right ureteroscopy confirmed an impacted right proximal ureteral calculus that was fragmented and basket extracted.  He also had a right upper pole calculus that was fragmented on dust settings to tiny fragments and larger fragments basket extracted.  He had several tiny lower pole calculi that were fragmented.  All stones treated.  5.  Right retrograde pyelogram after treatment of the stone showed no filling defect and no hydronephrosis  Indication: 68 year old male with a right renal and ureteral calculus presents for the previously mentioned operation.  Description of procedure:  The patient was identified and consent was obtained.  The patient was taken to the operating room and placed in the supine position.  The patient was placed under general anesthesia.  Perioperative antibiotics were administered.  The patient was placed in dorsal lithotomy.  Patient was prepped and draped in a standard sterile fashion and a timeout was performed.  A 21 French rigid cystoscope was advanced into the urethra and into the bladder.  Complete cystoscopy was performed with findings noted above.  Right ureter was cannulated with a sensor wire which was advanced up to the kidney under fluoroscopic guidance.  Semirigid ureteroscopy was performed alongside the wire up to the proximal  ureteral stone which was fragmented to smaller fragments followed by basket extraction.  The ureter was then clear up to the renal pelvis.  I advanced a second wire through the scope and into the kidney and withdrew the scope.  I advanced an 11 x 13 ureteral access sheath over one of the wires under continuous fluoroscopic guidance to the proximal ureter.  Inner sheath and wire were withdrawn.  Digital ureteroscopy was performed and a dominant upper pole calculus was lasered fragmented all dust settings to smaller fragments.  He also had some tiny calculi in the lower pole that were fragmented and washed into the upper pole.  A basket was used to extract some of the larger fragments which were actually quite small.  Once there were no clinically significant stone fragments I shot a retrograde pyelogram through the scope with findings noted above followed by withdrawal of the scope and the access sheath visualizing the ureter upon removal.  There were no ureteral calculi and no ureteral injury was identified.  I backloaded the wire onto a rigid cystoscope and advanced that into the bladder followed by routine placement of a 6 x 24 double-J ureteral stent.  Fluoroscopy confirmed proximal placement and direct visualization confirmed a good coil within the bladder.  I drained the bladder withdrew the scope.  Patient tolerated the procedure well was stable postoperatively.  Plan: Follow-up in 1 week for stent removal

## 2023-12-26 NOTE — Anesthesia Postprocedure Evaluation (Signed)
 Anesthesia Post Note  Patient: Jaime Dean  Procedure(s) Performed: CYSTOSCOPY/URETEROSCOPY/HOLMIUM LASER/STENT PLACEMENT (Right)     Patient location during evaluation: PACU Anesthesia Type: General Level of consciousness: awake Pain management: pain level controlled Vital Signs Assessment: post-procedure vital signs reviewed and stable Respiratory status: spontaneous breathing Cardiovascular status: blood pressure returned to baseline Postop Assessment: no apparent nausea or vomiting Anesthetic complications: no   No notable events documented.  Last Vitals:  Vitals:   12/26/23 1415 12/26/23 1430  BP: 120/67 105/72  Pulse: 66 67  Resp: 12 20  Temp:    SpO2: 96% 98%    Last Pain:  Vitals:   12/26/23 1430  TempSrc:   PainSc: 0-No pain                 Lauraine KATHEE [place]

## 2023-12-26 NOTE — Anesthesia Procedure Notes (Signed)
 Procedure Name: LMA Insertion Date/Time: 12/26/2023 12:27 PM  Performed by: Viviana Almarie DASEN, CRNAPre-anesthesia Checklist: Patient identified, Emergency Drugs available, Suction available and Patient being monitored Patient Re-evaluated:Patient Re-evaluated prior to induction Oxygen Delivery Method: Circle System Utilized Preoxygenation: Pre-oxygenation with 100% oxygen Induction Type: IV induction Ventilation: Mask ventilation without difficulty LMA: LMA inserted LMA Size: 5.0 Tube type: Oral Number of attempts: 1 Airway Equipment and Method: Bite block Placement Confirmation: positive ETCO2 and breath sounds checked- equal and bilateral Tube secured with: Tape Dental Injury: Teeth and Oropharynx as per pre-operative assessment

## 2023-12-26 NOTE — H&P (Signed)
 H&P  Chief Complaint: right renal/ureteral calculus  History of Present Illness: 68 YO M with a right renal/urteteral calculus presents for right URS/LL/stent  Past Medical History:  Diagnosis Date   Antiphospholipid syndrome    Arthritis    Asthma    mild no issues   Bipolar 1 disorder (HCC)    Diabetes mellitus type II    Glaucoma    Hearing loss    History of blood clots    DVT   History of kidney stones    Manic depression (HCC)    Plantar fasciitis    Pneumonia    As a child   Post traumatic stress disorder (PTSD)    [place]   Sleep apnea    cpap   Wears glasses    Past Surgical History:  Procedure Laterality Date   CATARACT EXTRACTION Bilateral    COLLATERAL LIGAMENT REPAIR, KNEE     CYSTOSCOPY     WITH STONE REMOVAL   EYE SURGERY Bilateral    LASER REPAIR OF GLAUCOMA   FOREIGN OBJECT REMOVED FROM RIGHT SCALP     IVC VENOGRAPHY N/A 12/18/2022   Procedure: IVC Venography;  Surgeon: Lanis Fonda BRAVO, MD;  Location: [facility] INVASIVE CV LAB;  Service: Cardiovascular;  Laterality: N/A;   KNEE ARTHROPLASTY Bilateral    KNEE ARTHROSCOPY     right knee   LAPROSCOPIC GASTRIC BANDING     LEFT EXTENSOR TENDON WRIST REPAIR     LOWER EXTREMITY VENOGRAPHY Right 12/18/2022   Procedure: LOWER EXTREMITY VENOGRAPHY;  Surgeon: Lanis Fonda BRAVO, MD;  Location: [facility] INVASIVE CV LAB;  Service: Cardiovascular;  Laterality: Right;   PERIPHERAL VASCULAR THROMBECTOMY N/A 12/18/2022   Procedure: PERIPHERAL VASCULAR THROMBECTOMY;  Surgeon: Lanis Fonda BRAVO, MD;  Location: [facility] INVASIVE CV LAB;  Service: Cardiovascular;  Laterality: N/A;   PERIPHERAL VASCULAR ULTRASOUND/IVUS Right 12/18/2022   Procedure: Peripheral Vascular Ultrasound/IVUS;  Surgeon: Lanis Fonda BRAVO, MD;  Location: [facility] INVASIVE CV LAB;  Service: Cardiovascular;  Laterality: Right;   TONSILLECTOMY AND ADENOIDECTOMY     TRANSURETHRAL RESECTION OF PROSTATE     UMBILICAL HERNIA REPAIR      Home Medications:  Medications Prior to  Admission  Medication Sig Dispense Refill Last Dose/Taking   apixaban  (ELIQUIS ) 5 MG TABS tablet Take 1 tablet (5 mg total) by mouth 2 (two) times daily. 60 tablet 3 12/24/2023   divalproex  (DEPAKOTE  ER) 500 MG 24 hr tablet Take 500 mg by mouth daily.   12/24/2023 Morning   ferrous sulfate 325 (65 FE) MG tablet Take 325 mg by mouth daily with breakfast.   12/24/2023 Morning   fexofenadine (ALLEGRA) 180 MG tablet Take 180 mg by mouth daily.   12/24/2023 Morning   FLUoxetine  (PROZAC ) 40 MG capsule Take 40 mg by mouth in the morning.   12/24/2023 Morning   JANUVIA 100 MG tablet Take 100 mg by mouth daily with breakfast.   12/24/2023 Morning   metFORMIN (GLUCOPHAGE) 1000 MG tablet Take 1,000 mg by mouth 2 (two) times daily with a meal.   12/24/2023 Morning   methocarbamol (ROBAXIN) 500 MG tablet Take 500 mg by mouth 3 (three) times daily.   12/24/2023 Morning   pregabalin  (LYRICA ) 225 MG capsule Take 450 mg by mouth 2 (two) times daily.   12/24/2023 Morning   rosuvastatin  (CRESTOR ) 20 MG tablet Take 1 tablet (20 mg total) by mouth daily. 90 tablet 3 12/24/2023   albuterol (VENTOLIN HFA) 108 (90 Base) MCG/ACT inhaler Inhale 3 puffs into the lungs  as needed for wheezing or shortness of breath.   Unknown   busPIRone  (BUSPAR ) 10 MG tablet Take 10-40 mg by mouth as needed (anxiety).   More than a month   ondansetron  (ZOFRAN -ODT) 8 MG disintegrating tablet Take 8 mg by mouth every 8 (eight) hours as needed for nausea or vomiting.   More than a month   oxyCODONE -acetaminophen  (PERCOCET/ROXICET) 5-325 MG tablet Take 1 tablet by mouth daily. (Patient not taking: Reported on 12/24/2023)   Not Taking   Allergies:  Allergies  Allergen Reactions   Penicillins Anaphylaxis    Patient took augmentin as an outpatient    Sulfa Antibiotics Swelling   Tegaderm Ag Mesh [Silver] Other (See Comments)    Blisters, peeling skin    Avelox [Moxifloxacin Hcl In Nacl] Rash   Clindamycin Rash    Family History  Problem Relation  Age of Onset   Arthritis Mother    Heart disease Father    Heart attack Father    Arthritis Father    Throat cancer Paternal Grandfather    Social History:  reports that he quit smoking about 20 years ago. His smoking use included cigarettes. He has never used smokeless tobacco. He reports that he does not drink alcohol and does not use drugs.  ROS: A complete review of systems was performed.  All systems are negative except for pertinent findings as noted. ROS   Physical Exam:  Vital signs in last 24 hours:   General:  Alert and oriented, No acute distress HEENT: Normocephalic, atraumatic Neck: No JVD or lymphadenopathy Cardiovascular: Regular rate and rhythm Lungs: Regular rate and effort Abdomen: Soft, nontender, nondistended, no abdominal masses Back: No CVA tenderness Extremities: No edema Neurologic: Grossly intact  Laboratory Data:  No results found for this or any previous visit (from the past 24 hours). No results found for this or any previous visit (from the past 240 hours). Creatinine: No results for input(s): CREATININE in the last 168 hours.  Impression/Assessment:  Right renal/ureteral calculus  Plan:  Proceed with cysto w/ right URS/LL/stent. R/b discussed  Sherwood JONETTA Edison, III 12/26/2023, 10:42 AM

## 2023-12-27 ENCOUNTER — Encounter (HOSPITAL_COMMUNITY): Payer: Self-pay | Admitting: Urology

## 2024-04-27 ENCOUNTER — Other Ambulatory Visit: Payer: Self-pay

## 2024-04-27 DIAGNOSIS — L03116 Cellulitis of left lower limb: Secondary | ICD-10-CM

## 2024-05-20 ENCOUNTER — Encounter

## 2024-05-20 ENCOUNTER — Ambulatory Visit (HOSPITAL_COMMUNITY)

## 2024-07-29 ENCOUNTER — Encounter: Admitting: Vascular Surgery

## 2024-07-29 ENCOUNTER — Ambulatory Visit (HOSPITAL_COMMUNITY)
